# Patient Record
Sex: Male | Born: 1955 | Race: White | Hispanic: No | Marital: Married | Smoking: Never smoker
Health system: Southern US, Community
[De-identification: ages and names within clinical notes are randomized; demographics above are authoritative.]

## PROBLEM LIST (undated history)

## (undated) DIAGNOSIS — D6852 Prothrombin gene mutation: Secondary | ICD-10-CM

## (undated) DIAGNOSIS — E785 Hyperlipidemia, unspecified: Secondary | ICD-10-CM

## (undated) DIAGNOSIS — M1990 Unspecified osteoarthritis, unspecified site: Secondary | ICD-10-CM

## (undated) DIAGNOSIS — I82409 Acute embolism and thrombosis of unspecified deep veins of unspecified lower extremity: Secondary | ICD-10-CM

## (undated) DIAGNOSIS — N189 Chronic kidney disease, unspecified: Secondary | ICD-10-CM

## (undated) DIAGNOSIS — D649 Anemia, unspecified: Secondary | ICD-10-CM

## (undated) HISTORY — PX: ROTATOR CUFF REPAIR: SHX139

## (undated) HISTORY — PX: REPLACEMENT TOTAL KNEE BILATERAL: SUR1225

---
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
## 2007-03-14 ENCOUNTER — Ambulatory Visit (HOSPITAL_BASED_OUTPATIENT_CLINIC_OR_DEPARTMENT_OTHER): Admission: RE | Admit: 2007-03-14 | Discharge: 2007-03-14 | Payer: Self-pay | Admitting: Orthopedic Surgery

## 2008-08-10 ENCOUNTER — Inpatient Hospital Stay (HOSPITAL_COMMUNITY): Admission: RE | Admit: 2008-08-10 | Discharge: 2008-08-13 | Payer: Self-pay | Admitting: Orthopedic Surgery

## 2009-07-05 ENCOUNTER — Inpatient Hospital Stay (HOSPITAL_COMMUNITY): Admission: RE | Admit: 2009-07-05 | Discharge: 2009-07-08 | Payer: Self-pay | Admitting: Orthopedic Surgery

## 2010-04-03 LAB — BASIC METABOLIC PANEL
CO2: 31 mEq/L (ref 19–32)
Chloride: 101 mEq/L (ref 96–112)
GFR calc Af Amer: >60 mL/min (ref >60)
Sodium: 136 mEq/L (ref 135–145)

## 2010-04-03 LAB — CBC
HCT: 33.3 % — ABNORMAL LOW (ref 39.0–52.0)
HCT: 33.9 % — ABNORMAL LOW (ref 39.0–52.0)
Hemoglobin: 11.6 g/dL — ABNORMAL LOW (ref 13.0–17.0)
Hemoglobin: 11.7 g/dL — ABNORMAL LOW (ref 13.0–17.0)
Hemoglobin: 12.6 g/dL — ABNORMAL LOW (ref 13.0–17.0)
MCHC: 34.4 g/dL (ref 30.0–36.0)
MCHC: 34.9 g/dL (ref 30.0–36.0)
MCV: 94.4 fL (ref 78.0–100.0)
MCV: 95.2 fL (ref 78.0–100.0)
Platelets: 153 10*3/uL (ref 150–400)
RBC: 3.58 MIL/uL — ABNORMAL LOW (ref 4.22–5.81)
RBC: 3.84 MIL/uL — ABNORMAL LOW (ref 4.22–5.81)
RDW: 12.9 % (ref 11.5–15.5)
WBC: 7.1 10*3/uL (ref 4.0–10.5)
WBC: 8.1 10*3/uL (ref 4.0–10.5)

## 2010-04-03 LAB — PROTIME-INR: INR: 1.16 (ref 0.00–1.49)

## 2010-04-04 LAB — DIFFERENTIAL
Basophils Absolute: 0 10*3/uL (ref 0.0–0.1)
Basophils Relative: 1 % (ref 0–1)
Eosinophils Absolute: 0.2 10*3/uL (ref 0.0–0.7)
Eosinophils Relative: 4 % (ref 0–5)
Monocytes Absolute: 0.4 10*3/uL (ref 0.1–1.0)
Monocytes Relative: 6 % (ref 3–12)
Neutro Abs: 3.1 10*3/uL (ref 1.7–7.7)

## 2010-04-04 LAB — BASIC METABOLIC PANEL
CO2: 28 mEq/L (ref 19–32)
Calcium: 9.3 mg/dL (ref 8.4–10.5)
Chloride: 106 mEq/L (ref 96–112)
GFR calc Af Amer: >60 mL/min (ref >60)
Glucose, Bld: 100 mg/dL — ABNORMAL HIGH (ref 70–99)
Sodium: 140 mEq/L (ref 135–145)

## 2010-04-04 LAB — CBC
HCT: 44.3 % (ref 39.0–52.0)
Hemoglobin: 15.3 g/dL (ref 13.0–17.0)
MCHC: 34.6 g/dL (ref 30.0–36.0)
MCV: 94.1 fL (ref 78.0–100.0)
RBC: 4.7 MIL/uL (ref 4.22–5.81)
RDW: 13.3 % (ref 11.5–15.5)

## 2010-04-04 LAB — APTT: aPTT: 26 seconds (ref 24–37)

## 2010-04-04 LAB — URINALYSIS, ROUTINE W REFLEX MICROSCOPIC
Bilirubin Urine: NEGATIVE
Glucose, UA: NEGATIVE mg/dL
Hgb urine dipstick: NEGATIVE
Ketones, ur: NEGATIVE mg/dL
Nitrite: NEGATIVE
Specific Gravity, Urine: 1.011 (ref 1.005–1.030)
pH: 6 (ref 5.0–8.0)

## 2010-04-24 LAB — DIFFERENTIAL
Eosinophils Absolute: 0.2 10*3/uL (ref 0.0–0.7)
Eosinophils Relative: 3 % (ref 0–5)
Lymphocytes Relative: 31 % (ref 12–46)
Lymphs Abs: 1.7 10*3/uL (ref 0.7–4.0)
Monocytes Absolute: 0.4 10*3/uL (ref 0.1–1.0)

## 2010-04-24 LAB — CBC
HCT: 33.7 % — ABNORMAL LOW (ref 39.0–52.0)
HCT: 45.3 % (ref 39.0–52.0)
Hemoglobin: 12 g/dL — ABNORMAL LOW (ref 13.0–17.0)
Hemoglobin: 16 g/dL (ref 13.0–17.0)
MCV: 95.6 fL (ref 78.0–100.0)
MCV: 95.6 fL (ref 78.0–100.0)
MCV: 95.9 fL (ref 78.0–100.0)
Platelets: 121 10*3/uL — ABNORMAL LOW (ref 150–400)
Platelets: 153 10*3/uL (ref 150–400)
Platelets: 184 10*3/uL (ref 150–400)
RBC: 3.53 MIL/uL — ABNORMAL LOW (ref 4.22–5.81)
RBC: 3.59 MIL/uL — ABNORMAL LOW (ref 4.22–5.81)
RDW: 12.8 % (ref 11.5–15.5)
WBC: 10.1 10*3/uL (ref 4.0–10.5)
WBC: 5.7 10*3/uL (ref 4.0–10.5)
WBC: 7.4 10*3/uL (ref 4.0–10.5)
WBC: 8.5 10*3/uL (ref 4.0–10.5)

## 2010-04-24 LAB — PROTIME-INR
INR: 1.2 (ref 0.00–1.49)
INR: 1.6 — ABNORMAL HIGH (ref 0.00–1.49)
Prothrombin Time: 19.8 seconds — ABNORMAL HIGH (ref 11.6–15.2)
Prothrombin Time: 26.6 seconds — ABNORMAL HIGH (ref 11.6–15.2)

## 2010-04-24 LAB — BASIC METABOLIC PANEL
BUN: 14 mg/dL (ref 6–23)
BUN: 9 mg/dL (ref 6–23)
Calcium: 8.7 mg/dL (ref 8.4–10.5)
Chloride: 106 mEq/L (ref 96–112)
Creatinine, Ser: 1.1 mg/dL (ref 0.4–1.5)
GFR calc Af Amer: >60 mL/min (ref >60)
GFR calc non Af Amer: >60 mL/min (ref >60)
GFR calc non Af Amer: >60 mL/min (ref >60)
Glucose, Bld: 101 mg/dL — ABNORMAL HIGH (ref 70–99)
Potassium: 4.3 mEq/L (ref 3.5–5.1)
Sodium: 139 mEq/L (ref 135–145)

## 2010-04-24 LAB — TYPE AND SCREEN
ABO/RH(D): A POS
Antibody Screen: NEGATIVE

## 2010-04-24 LAB — URINALYSIS, ROUTINE W REFLEX MICROSCOPIC
Glucose, UA: NEGATIVE mg/dL
Hgb urine dipstick: NEGATIVE
pH: 6 (ref 5.0–8.0)

## 2010-04-24 LAB — ABO/RH: ABO/RH(D): A POS

## 2010-05-31 NOTE — Op Note (Signed)
Blake Kennedy, Blake Kennedy               ACCOUNT NO.:  [id number]   MEDICAL RECORD NO.:  [id number]          PATIENT TYPE:  INP   LOCATION:  5040                         FACILITY:  [facility]   PHYSICIAN:  Feliberto Gottron. Turner Daniels, M.D.   DATE OF BIRTH:  1955-11-20   DATE OF PROCEDURE:  08/10/2008  DATE OF DISCHARGE:                               OPERATIVE REPORT   PREOPERATIVE DIAGNOSIS:  End-stage arthritis of the left knee.   POSTOPERATIVE DIAGNOSIS:  End-stage arthritis of the left knee.   PROCEDURE:  Left total knee arthroplasty using the DePuy Sigma RP  components for left femur, 5 tibial baseplate, 10-mm Sigma RP bearing,  38-mm patellar button.  All components cemented double batch of DePuy HV  cement with 1500 mg of Zinacef in the cement.   SURGEON:  Feliberto Gottron.  Turner Daniels, MD   FIRST ASSISTANT:  Shirl Harris, PA-C   ANESTHETIC:  General endotracheal.   ESTIMATED BLOOD LOSS:  Minimal.   FLUID REPLACEMENT:  1500 mL of crystalloid.   DRAINS PLACED:  Foley catheter and two medium Hemovacs.   TOURNIQUET TIME:  1 hour and 30 minutes.   URINE OUTPUT:  300 mL.   INDICATIONS FOR PROCEDURE:  A 55 year old gentleman with end-stage  arthritis of the left knee with a significant varus deformity of about 5-  8 degrees, 10 degrees flexion contracture, bone-on-bone arthritic  changes by x-ray with significant pain who has failed conservative  treatment with anti-inflammatory medicines, physical therapy, cortisone  injections were provided temporary relief as did Supartz injections.  Unfortunately, his pain has returned.  He has a persistent lymph pain  that prevents activities of daily living and desires elective total knee  arthroplasty.  Risks and benefits of surgery were discussed and  questions were answered.   DESCRIPTION OF PROCEDURE:  The patient identified by armband and  received a left femoral nerve block in the holding area at [facility] and then preoperative IV antibiotics, 2  g of Ancef.  Taken to  operating room #5, appropriate anesthetic monitors were attached and  general endotracheal anesthesia induced with the patient in supine  position.  Foley catheter inserted.  Tourniquet applied high to the left  thigh.  Lateral post and foot positioner applied to the table and the  left lower extremity was prepped and draped in the usual sterile fashion  from the ankle to the tourniquet.  Time-out procedure performed.  The  left lower extremity was wrapped with an Esmarch bandage, tourniquet  inflated to 350 mmHg, we began the operation itself by making an  anterior midline incision centered over the patella, starting one  handbreadth above the patella and then going 1 cm medial and 2 cm distal  to the tibial tubercle.  Small bleeders and the subcutaneous tissue  identified and cauterized.  The transverse retinaculum over the patella  was sectioned and reflected medially and a medial parapatellar  arthrotomy was accomplished maintaining a 2-3 mm thick cuff of  quadriceps tendon distally for later repair.  The patella was everted  and prepatellar fat pad resected.  On the  medial side, the superficial  medial collateral ligament was then elevated off the proximal tibia left  intact distally going from anterior to posterior.  The patella was  everted.  The knee was then flexed exposing the arthritic medial femoral  condyle and medial tibial plateau, which were down to bone-on-bone.  The  anterior one-half of the menisci were resected, a 0.5-inch osteotome was  used to remove notch osteophytes and then the cruciate ligaments were  also resected.  A posteromedial Z retractor was placed on the proximal  tibia.  A McCulloch retractor through the notch and a lateral Homan  retractor exposing the proximal tibia with the knee hyperflexed.  We  then entered the center of the proximal tibia with a step drill followed  by the intramedullary rod and a 2-degree posterior slope  proximal tibial  cutting guide was pinned along the anterior-posterior axis allowing  removal of 4-5 mm of bone medially and about 9-10 mm of bone laterally.  The proximal tibial cut was then accomplished with the oscillating saw  without difficulty.  We then entered the distal femur 2 mm anterior to  the PCL origin with a step drill followed by the intramedullary rod set  at 5 degrees of left for an 11-mm distal femoral cut.  The guide was  pinned along the epicondylar axis and the distal femoral cut  accomplished.  We sized for a #4 femoral component, set the pins at 3  degrees of external rotation and hammered into place the distal femoral  cutting guide, fixed with the screw pins.  The anterior, posterior, and  chamfer cuts were then accomplished without difficulty followed by the  Sigma RP box cut.  The patella was then measured at 27 mm, felt to be a  38 or 41 button.  We set the cutting guide at 16 and removed the  posterior 11 mm of the patella without difficulty and sized for a 38  button.  The lollipop was drilled.  At this point,     the knee was  hyperflexed.  We sized for a #5 tibial baseplate, pinned the trial plate  in place followed by the smokestack in the conical reamer and the Delta  fin keel punch.  A 4 left femoral component was then hammered into place  and drilled for the lugs.  A 10-mm sigma RP bearing was snapped into  place, the knee brought to full extension without difficulty and  ligamentous stability was felt to be excellent.  The 38-mm trial button  on the patella tracked without any thumb pressure whatsoever.  At this  point, the trial components were removed, all bony surfaces were water  picked, clean, dried with suction and sponges.  A double batch of DePuy  HV cement with 1500 mg Zinacef was then mixed at the back table and  applied to all bony and metallic mating surfaces except for the  posterior condyles of the femur itself.  In order we hammered  into  place, a #5 tibial baseplate, a 4 left femoral component and pressed the  38-mm patellar button into place and held with a clamp and removed the  excess cement.  A 10-mm Sigma RP bearing was then snapped into place,  the knee brought to full extension and once again excess cement was  removed with [employer].  While the cement cured, a medium Hemovac  drains were placed from anterolateral approach and the wound irrigated  out normal saline solution pulse lavage.  After the cement had cured, we  checked our tracking one more time.  The parapatellar arthrotomy closed  with running #1 Vicryl suture, the subcutaneous tissue with 0 and 2-0  undyed Vicryl suture and the skin with skin staples.  A dressing of  Xeroform, 4 x 4 dressing sponges, Webril, and Ace wrap was then applied.  The tourniquet was let down.  The patient was awakened and taken to the  recovery room without difficulty.      Feliberto Gottron. Turner Daniels, M.D.  Electronically Signed     FJR/MEDQ  D:  08/10/2008  T:  08/10/2008  Job:  [id number]

## 2010-05-31 NOTE — Op Note (Signed)
NAME:  Blake Kennedy, LIMBAUGH               ACCOUNT NO.:  [id number]   MEDICAL RECORD NO.:  [id number]          PATIENT TYPE:  AMB   LOCATION:  NESC                         [facility]ACILITY:  [facility]   PHYSICIAN:  Deidre Ala, M.D.    DATE OF BIRTH:  20-Jul-1955   DATE OF PROCEDURE:  03/14/2007  DATE OF DISCHARGE:                               OPERATIVE REPORT   PREOPERATIVE DIAGNOSIS:  Left knee medial meniscus tear by MRI.   POSTOPERATIVE DIAGNOSES:  1. Grade 2 to 4 degenerative joint disease, medial compartment.  2. 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  3. Grade 2 stretch, anterior cruciate ligament.  4. Degenerative anterior posterior lateral meniscus tear.  5. Tight lateral retinaculum.  6. Large medial and lateral plicas.   PROCEDURE:  1. Left knee operative arthroscopy with abrasion ablation      chondroplasty, medial femoral condyle, medial tibial plateau,      patella and trochlea.  2. Shaving lateral meniscus.  3. Ablator shrinkage of anterior cruciate ligament.  4. Lateral retinacular release.  5. Medial and lateral plica excision.   SURGEON:  Doristine Section, M.D.   ASSISTANT:  Phineas Semen, P.A.-C.   ANESTHESIA:  General with LMA.   CULTURES:  None.   DRAINS:  None.   ESTIMATED BLOOD LOSS:  Minimal.   TOURNIQUET TIME:  36 minutes.   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 pediatrics who encouraged him to  come see a doctor with a 30 day history of increasing left knee pain  after a hard tennis, golf, paint ball, etc.  His date of first visit was  September 24, 2006.  He can remembered a plant and a twist, has had some  catching, locking, giving way, tenderness at the medial joint line and  lateral gastroc.  When we first saw him, we felt that he had evidence of  some mild narrowing on his x-ray, some patellofemoral osteophytes.  We  felt given the tenderness in the posterior lateral gastroc  and the sense  that he had a small posterolateral Baker's cyst with a positive calf  squeeze.  It was my feeling that he probably had a degenerative complex  posterior horn medial meniscus tear with probable Baker's cyst rupture  into the lateral gastroc versus a gastroc strain.  We did do a Doppler  test to rule out deep venous thrombosis and it was negative.  Ultimately  an MRI was obtained and the reading was that the cruciate ligaments were  intact.  There was mucinous degeneration of the posterior horn of the  medial meniscus with a partial tear at the meniscal root attachment with  a accompanying marrow edema in the tibia.  The lateral meniscus was  intact.  There were intact ligamentous structures.  There was mild  tricompartmental chondromalacia but no cartilage defects.  Small joint  effusion, no Baker's cyst, mild pes anserine bursitis.  He therefore was  continued on with conservative management, cortisone injection, etc.  The patient did not make ultimately good progress.  He  continued to have  pain and a little bit of catching and desired to proceed with surgical  intervention and diagnostic and operative arthroscopy.  At surgery we  saw on the medial femoral condyle, lateral aspect to bone, DJD grade 3  blending to grade 4 in an area of about the size of a dime.  There were  corresponding lesion on the medial tibial plateau with one area  underneath the meniscus that was also to bone with grade 3 changes  surrounding this.  This was all shaved, smooth and ablated.  Fortunately  his medial meniscus to direct vision and probing was intact and will  serve as a good joint pad.  The ACL however was torn was spilling of the  fibers centrally.  It had an excursion of about 5-7 mm anteriorly under  direct vision, but it did have an end point.  This we tried to smooth  and ablate to shrink somewhat.  The lateral meniscus had degenerative  fraying anterior-posterior but the joint  surfaces looked quite pristine.  There were huge medial lateral plicas in the gutters and behind the  patella, there was a grade 3 chondromalacia patella, mostly lateral and  grade 2 changes in the trochlea.  All of this was smoothed and shaved  and ablated with the ablator on 1.  There was also some synovitis in the  pouch.  There was lateral patellar tilt and track and lateral  retinacular release was carried out as well as ablation abrasion  chondroplasties, trimming of the lateral meniscus, plica excision and  smoothing of the medial femoral condyle, medial tibial plateau and the  ACL as above.   PROCEDURE:  With adequate anesthesia obtained LMA technique, 1 gram  Ancef given IV prophylaxis, the patient was placed in the supine  position.  The left lower extremity was prepped from the malleoli to the  leg holder in the standard fashion.  After standard prepping and  draping, Esmarch exsanguination was used.  The tourniquet was let up to  350 mmHg.  Superior lateral inflow portal was made.  The knee was  insufflated with normal saline with arthroscopic pump.  Medial and  lateral scope portals were then made.  The joint was thoroughly  inspected.  I then shaved the medial plica back to the sidewall and  lysed the medial band.  Ablator was used to smooth the surface.  There  was some fraying on the medial femoral condyle that I ablated.  I then  checked the medial meniscus, probed it and inspected it and found the  DJD on the joint surfaces which I shaved and smoothed with the ablator  on 1, making smooth congruent surfaces.  I then checked the ACL, probed  its center, did a stress test on it with anterior drawer and then used  the ablator inside and on top and outside to shrink it down with the  ablator on the coag noted.  I then shaved out gutter synovitis on the  lateral side, probed the lateral meniscus and shaved anterior-posterior  and smoothed with the ablator.  I then shaved out  the pouch synovitis.  I then smoothed the posterior patella and the trochlea and did an  arthroscopic lateral retinacular release from vastus for lateralis to  the joint line with good improvement in tilt and track.  Coagulation was  carried on the lateral geniculate vessels.  The knee was then irrigated  through the scope, 0.5% Marcaine with morphine was injected  into the  joint.  The portals were left open.  A bulky sterile compressive  dressing was applied with lateral foam pad for tamponade and EZ-wrap  placed.  The patient then having tolerated procedure well, was awakened,  taken to recovery room in satisfactory condition to be discharged per  outpatient routine, given Percocet for pain and told to call the office  for recheck tomorrow about 2:30.           ______________________________  V. Charlesetta Shanks, M.D.     VEP/MEDQ  D:  03/14/2007  T:  03/15/2007  Job:  [id number]

## 2010-06-03 NOTE — Discharge Summary (Signed)
Blake Kennedy, Blake Kennedy               ACCOUNT NO.:  [id number]   MEDICAL RECORD NO.:  [id number]          PATIENT TYPE:  INP   LOCATION:  5040                         FACILITY:  [facility]   PHYSICIAN:  Feliberto Gottron. Turner Daniels, M.D.   DATE OF BIRTH:  06/05/1955   DATE OF ADMISSION:  08/10/2008  DATE OF DISCHARGE:  08/13/2008                               DISCHARGE SUMMARY   CHIEF COMPLAINTS:  Left knee pain.   HISTORY OF PRESENT ILLNESS:  This is a 55 year old gentleman, who  complains of severe unremitting pain in his left knee despite  conservative treatment with anti-inflammatories, pain medication, and  activity modification.  He desires a surgical intervention.  All risks  and benefits of surgery were discussed with the patient's past medical  history.  He has no medical problems.  He has not had any surgery.   SOCIAL HISTORY:  He denies the use of tobacco and drinks occasional  alcohol.   FAMILY HISTORY:  Positive for coronary artery disease and hypertension.   ALLERGIES:  He has no known drug allergies.   CURRENT MEDICATIONS:  Ibuprofen 800 mg 1 p.o. t.i.d. p.r.n. pain.   PHYSICAL EXAMINATION:  Gross examination of the left knee demonstrates  the patient have a varus deformity and a 10-degree flexion contracture.  He is neurovascularly intact.   X-rays demonstrate bone-on-bone degenerative changes in the medial  compartment of the left knee.   PREOPERATIVE LABORATORIES:  White blood cells 5.7, red blood cells 4.74,  hemoglobin 16, hematocrit 45.3, and platelets 184.  PT 13.9, INR 1.0,  and PTT 26.  Sodium 139, potassium 4.3, chloride 106, glucose 101, BUN  14, and creatinine 1.13.  Urinalysis was within normal limits.   HOSPITAL COURSE:  Mr. Pepitone was admitted to [facility] on August 10, 2008, when he underwent left total knee arthroplasty.  The procedure was  performed by Dr. Gean Birchwood, and the patient tolerated it well.  Two  Hemovac drains were placed in the left knee.   Perioperative Foley  catheter was also placed.  The patient was placed on Lovenox and  Coumadin for DVT prophylaxis and was transferred to the floor.  On the  first postoperative day, the patient was awake and alert.  He reported 1  episode of nausea and vomiting that seemed to be improving.  He  complained of minimal pain in his left knee at rest.  His surgical drain  was removed without difficulty.  His dressing remained clean and dry.  Foley catheter was removed after physical therapy.  On the second  postoperative day, the patient was awake and alert.  He reported that  his pain was well controlled.  He ambulated 200 feet with physical  therapy.  Hemoglobin was 11.9.  Surgical dressing was partially soaked,  it was changed, and his incision was found to be benign.  On the third  postoperative day, the patient was awake and alert.  He was eating well  and denied any nausea or vomiting.  Hemoglobin was 12 and his dressing  remained clean and dry.  He passed all of  his physical therapy goals and  was discharged to home.   DISPOSITION:  The patient was discharged home on August 13, 2008.  He was  weightbearing as tolerated.  Home Health would manage his wound,  Coumadin and physical therapy.  He would return to the clinic in 7-10  days for x-rays and staple removal.   Discharge medicines were as per the [facility] with the addition of Percocet 5  mg 1-2 tablets p.o. q.4 h. p.r.n. pain and Coumadin to take as directed  with a target INR of 1.5 to 2.   FINAL DIAGNOSIS:  End-stage degenerative joint disease of the left knee.      Shirl Harris, PA      Feliberto Gottron. Turner Daniels, M.D.  Electronically Signed    JW/MEDQ  D:  08/31/2008  T:  09/01/2008  Job:  [id number]

## 2010-10-07 LAB — POCT HEMOGLOBIN-HEMACUE: Hemoglobin: 16.4

## 2011-05-02 IMAGING — CR DG CHEST 2V
2 series · 2 of 2 positions shown · non-contrast
Comparison: None.

CLINICAL DATA: Osteoarthritis preadmit.

CHEST - 2 VIEW

[view not recorded (1 of 2)]
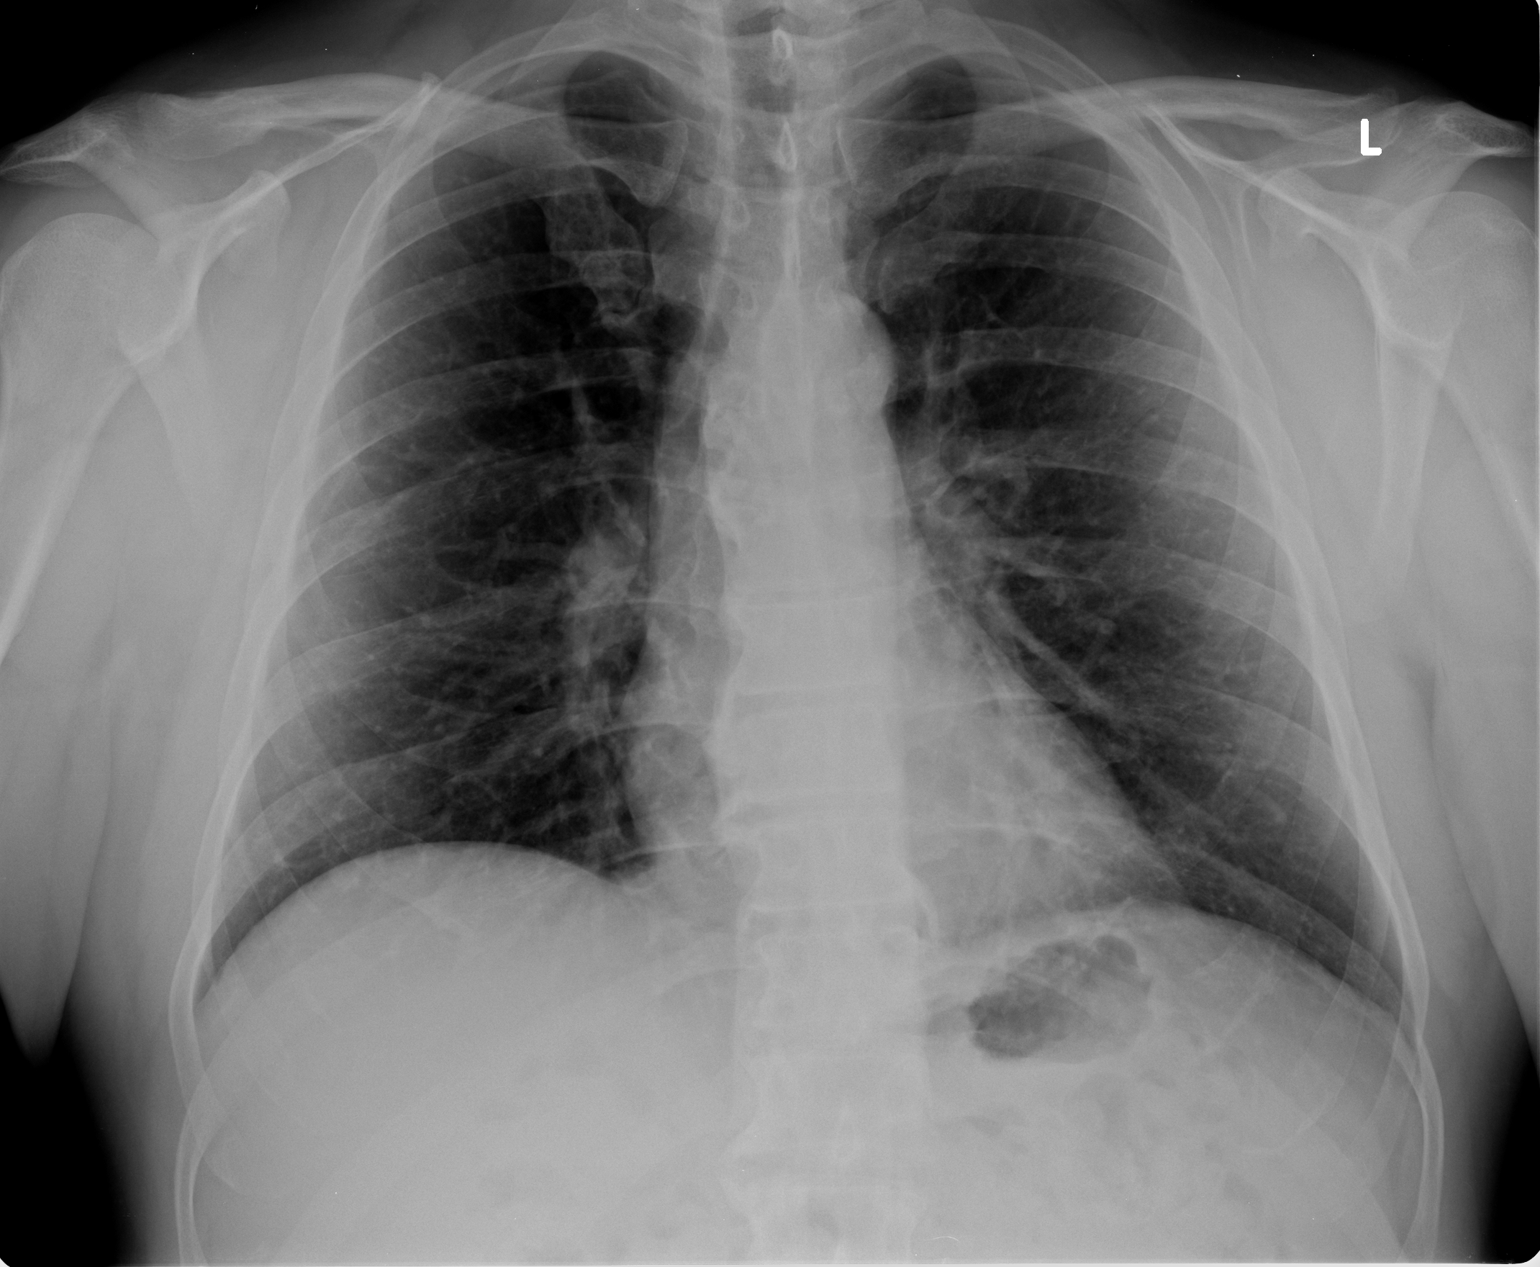

[view not recorded (2 of 2)]
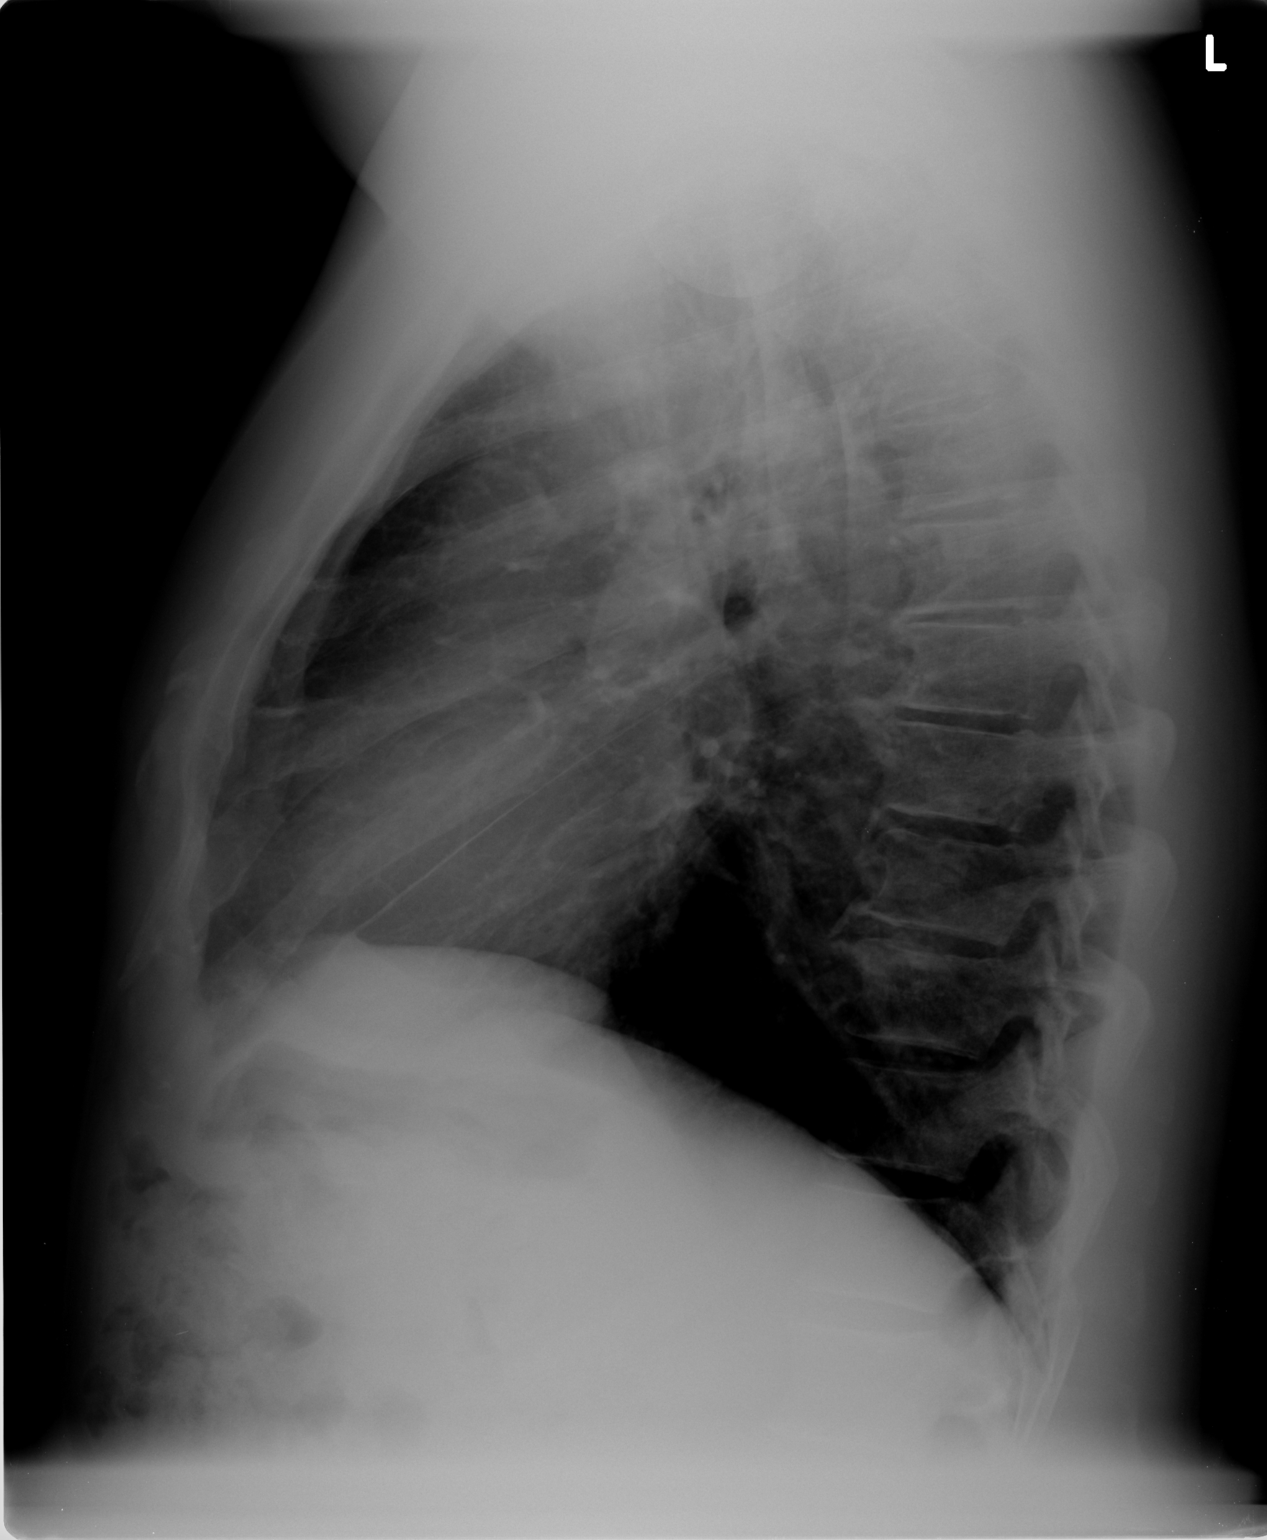

[2 of 2 positions shown; findings below may reference images not displayed]

FINDINGS: Heart size is normal and the vascularity is normal.  The
lungs are clear without infiltrate or effusion.  There is no mass
or adenopathy.  Mild degenerative changes in the thoracic spine.
IMPRESSION: No active cardiopulmonary disease.

## 2019-12-25 ENCOUNTER — Encounter (HOSPITAL_COMMUNITY): Payer: Self-pay

## 2019-12-25 ENCOUNTER — Other Ambulatory Visit: Payer: Self-pay | Admitting: Orthopedic Surgery

## 2019-12-25 DIAGNOSIS — T8484XA Pain due to internal orthopedic prosthetic devices, implants and grafts, initial encounter: Secondary | ICD-10-CM | POA: Diagnosis present

## 2019-12-25 DIAGNOSIS — Z01818 Encounter for other preprocedural examination: Secondary | ICD-10-CM

## 2019-12-25 DIAGNOSIS — Z96652 Presence of left artificial knee joint: Secondary | ICD-10-CM | POA: Diagnosis present

## 2019-12-25 NOTE — Patient Instructions (Addendum)
DUE TO COVID-19 ONLY ONE VISITOR IS ALLOWED TO COME WITH YOU AND STAY IN THE WAITING ROOM ONLY DURING PRE OP AND PROCEDURE.   IF YOU WILL BE ADMITTED INTO THE HOSPITAL YOU ARE ALLOWED ONE SUPPORT PERSON DURING VISITATION HOURS ONLY (10AM -8PM)   . The support person may change daily. . The support person must pass our screening, gel in and out, and wear a mask at all times, including in the patient's room. . Patients must also wear a mask when staff or their support person are in the room.   COVID SWAB TESTING MUST BE COMPLETED ON:  Friday, 12-26-19 @ 10:45 AM   [address] [place], [place] [address]  (Must self quarantine after testing. Follow instructions on handout.)        Your procedure is scheduled on:  Monday, 12-29-19   Report to [facility] Main  Entrance   Report to admitting at 1:00 PM   Call this number if you have problems the morning of surgery [phone]   Do not eat food :After Midnight.   May have liquids until 12:00 PM day of surgery  CLEAR LIQUID DIET  Foods Allowed                                                                     Foods Excluded  Water, Black Coffee and tea, regular and decaf              liquids that you cannot  Plain Jell-O in any flavor  (No red)                                    see through such as: Fruit ices (not with fruit pulp)                                      milk, soups, orange juice              Iced Popsicles (No red)                                      All solid food                                   Apple juices Sports drinks like Gatorade (No red) Lightly seasoned clear broth or consume(fat free) Sugar, honey syrup    Oral Hygiene is also important to reduce your risk of infection.                                    Remember - BRUSH YOUR TEETH THE MORNING OF SURGERY WITH YOUR REGULAR TOOTHPASTE   Do NOT smoke after Midnight   Take these medicines the morning of surgery with A SIP OF WATER:  Lipitor  You may not have any metal on your body including jewelry, and body piercings             Do not wear  lotions, powders, perfumes/cologne, or deodorant             Men may shave face and neck.   Do not bring valuables to the hospital. [facility] IS NOT RESPONSIBLE   FOR VALUABLES.   Contacts, dentures or bridgework may not be worn into surgery.   Patients discharged the day of surgery will not be allowed to drive home.   Special Instructions: Bring a copy of your healthcare power of attorney and living will documents         the day of surgery if you haven't scanned them in before.              Please read over the following fact sheets you were given: IF YOU HAVE QUESTIONS ABOUT YOUR PRE OP INSTRUCTIONS PLEASE CALL [phone]   [facility] - Preparing for Surgery Before surgery, you can play an important role.  Because skin is not sterile, your skin needs to be as free of germs as possible.  You can reduce the number of germs on your skin by washing with CHG (chlorahexidine gluconate) soap before surgery.  CHG is an antiseptic cleaner which kills germs and bonds with the skin to continue killing germs even after washing. Please DO NOT use if you have an allergy to CHG or antibacterial soaps.  If your skin becomes reddened/irritated stop using the CHG and inform your nurse when you arrive at Short Stay. Do not shave (including legs and underarms) for at least 48 hours prior to the first CHG shower.  You may shave your face/neck.  Please follow these instructions carefully:  1.  Shower with CHG Soap the night before surgery and the  morning of surgery.  2.  If you choose to wash your hair, wash your hair first as usual with your normal  shampoo.  3.  After you shampoo, rinse your hair and body thoroughly to remove the shampoo.                             4.  Use CHG as you would any other liquid soap.  You can apply chg directly to the skin and wash.  Gently  with a scrungie or clean washcloth.  5.  Apply the CHG Soap to your body ONLY FROM THE NECK DOWN.   Do   not use on face/ open                           Wound or open sores. Avoid contact with eyes, ears mouth and   genitals (private parts).                       Wash face,  Genitals (private parts) with your normal soap.             6.  Wash thoroughly, paying special attention to the area where your    surgery  will be performed.  7.  Thoroughly rinse your body with warm water from the neck down.  8.  DO NOT shower/wash with your normal soap after using and rinsing off the CHG Soap.                9.  Pat yourself dry with a clean towel.            10.  Wear clean pajamas.            11.  Place clean sheets on your bed the night of your first shower and do not  sleep with pets. Day of Surgery : Do not apply any lotions/deodorants the morning of surgery.  Please wear clean clothes to the hospital/surgery center.  FAILURE TO FOLLOW THESE INSTRUCTIONS MAY RESULT IN THE CANCELLATION OF YOUR SURGERY  PATIENT SIGNATURE_________________________________  NURSE SIGNATURE__________________________________  ________________________________________________________________________  WHAT IS A BLOOD TRANSFUSION? Blood Transfusion Information  A transfusion is the replacement of blood or some of its parts. Blood is made up of multiple cells which provide different functions.  Red blood cells carry oxygen and are used for blood loss replacement.  White blood cells fight against infection.  Platelets control bleeding.  Plasma helps clot blood.  Other blood products are available for specialized needs, such as hemophilia or other clotting disorders. BEFORE THE TRANSFUSION  Who gives blood for transfusions?   Healthy volunteers who are fully evaluated to make sure their blood is safe. This is blood bank blood. Transfusion therapy is the safest it has ever been in the practice of medicine. Before  blood is taken from a donor, a complete history is taken to make sure that person has no history of diseases nor engages in risky social behavior (examples are intravenous drug use or sexual activity with multiple partners). The donor's travel history is screened to minimize risk of transmitting infections, such as malaria. The donated blood is tested for signs of infectious diseases, such as HIV and hepatitis. The blood is then tested to be sure it is compatible with you in order to minimize the chance of a transfusion reaction. If you or a relative donates blood, this is often done in anticipation of surgery and is not appropriate for emergency situations. It takes many days to process the donated blood. RISKS AND COMPLICATIONS Although transfusion therapy is very safe and saves many lives, the main dangers of transfusion include:   Getting an infectious disease.  Developing a transfusion reaction. This is an allergic reaction to something in the blood you were given. Every precaution is taken to prevent this. The decision to have a blood transfusion has been considered carefully by your caregiver before blood is given. Blood is not given unless the benefits outweigh the risks. AFTER THE TRANSFUSION  Right after receiving a blood transfusion, you will usually feel much better and more energetic. This is especially true if your red blood cells have gotten low (anemic). The transfusion raises the level of the red blood cells which carry oxygen, and this usually causes an energy increase.  The nurse administering the transfusion will monitor you carefully for complications. HOME CARE INSTRUCTIONS  No special instructions are needed after a transfusion. You may find your energy is better. Speak with your caregiver about any limitations on activity for underlying diseases you may have. SEEK MEDICAL CARE IF:   Your condition is not improving after your transfusion.  You develop redness or irritation  at the intravenous (IV) site. SEEK IMMEDIATE MEDICAL CARE IF:  Any of the following symptoms occur over the next 12 hours:  Shaking chills.  You have a temperature by mouth above 102 F (38.9 C), not controlled by medicine.  Chest, back, or muscle pain.  People around you feel you are not acting correctly or are  confused.  Shortness of breath or difficulty breathing.  Dizziness and fainting.  You get a rash or develop hives.  You have a decrease in urine output.  Your urine turns a dark color or changes to pink, red, or brown. Any of the following symptoms occur over the next 10 days:  You have a temperature by mouth above 102 F (38.9 C), not controlled by medicine.  Shortness of breath.  Weakness after normal activity.  The white part of the eye turns yellow (jaundice).  You have a decrease in the amount of urine or are urinating less often.  Your urine turns a dark color or changes to pink, red, or brown. Document Released: 12/31/1999 Document Revised: 03/27/2011 Document Reviewed: 08/19/2007 [facility] Patient Information 2014 [place], [place].  _______________________________________________________________________

## 2019-12-25 NOTE — Progress Notes (Addendum)
COVID Vaccine Completed:  x2 Date COVID Vaccine completed:  04-04-19 & 05-02-19 COVID vaccine manufacturer: Pfizer    Moderna   [employer]   PCP - Olivia Canter, MD Cardiologist - [place] [facility] eval for DVT.  Last OV 07-05-11  Chest x-ray -  EKG -  12-26-19 in Epic Stress Test -  ECHO -  Cardiac Cath -  Pacemaker/ICD device last checked:  Sleep Study -  CPAP -   Fasting Blood Sugar -  Checks Blood Sugar _____ times a day  Blood Thinner Instructions: Xarelto 20 mg (hx of DVT).  Pt to check again today when he is to stop. Aspirin Instructions: Last Dose:12-26-19  Anesthesia review: Prothrombin three genetic mutation.  Patient denies shortness of breath, fever, cough and chest pain at PAT appointment   Patient verbalized understanding of instructions that were given to them at the PAT appointment. Patient was also instructed that they will need to review over the PAT instructions again at home before surgery.

## 2019-12-25 NOTE — Progress Notes (Signed)
Please enter orders for PAT visit 12-26-19.

## 2019-12-25 NOTE — H&P (Signed)
TOTAL KNEE REVISION ADMISSION H&P  Patient is being admitted for left revision total knee arthroplasty.  Subjective:  Chief Complaint:left knee pain.  HPI: Blake Kennedy, 64 y.o. male, has a history of pain and functional disability in the left knee(s) due to failed previous arthroplasty and patient has failed non-surgical conservative treatments for greater than 12 weeks to include NSAID's and/or analgesics, flexibility and strengthening excercises and activity modification. The indications for the revision of the total knee arthroplasty are bearing surface wear leading to implant or knee misalignment. Onset of symptoms was abrupt starting 1 years ago with rapidlly worsening course since that time.  Prior procedures on the left knee(s) include arthroplasty.  Patient currently rates pain in the left knee(s) at 10 out of 10 with activity. There is night pain, worsening of pain with activity and weight bearing, pain that interferes with activities of daily living and pain with passive range of motion.  Patient has evidence of joint space narrowing by imaging studies. This condition presents safety issues increasing the risk of falls.    There is no current active infection.  Patient Active Problem List   Diagnosis Date Noted  . Painful total knee replacement, left (HCC) 12/25/2019   Past Medical History:  Diagnosis Date  . Chronic kidney disease   . DVT (deep venous thrombosis) (HCC)       No current facility-administered medications for this encounter.   Current Outpatient Medications  Medication Sig Dispense Refill Last Dose  . atorvastatin (LIPITOR) 10 MG tablet Take 10 mg by mouth daily.     . rivaroxaban (XARELTO) 10 MG TABS tablet Take 10 mg by mouth daily.     . sildenafil (VIAGRA) 50 MG tablet Take 50 mg by mouth daily as needed for erectile dysfunction.      Not on File  Social History   Tobacco Use  . Smoking status: Not on file  . Smokeless tobacco: Not on file  Substance  Use Topics  . Alcohol use: Not on file    No family history on file.    Review of Systems  Constitutional: Negative.   HENT: Negative.   Eyes: Negative.   Respiratory: Negative.   Cardiovascular: Negative.   Gastrointestinal: Negative.   Endocrine: Negative.   Genitourinary: Negative.   Musculoskeletal: Positive for arthralgias.  Skin: Negative.   Allergic/Immunologic: Negative.   Neurological: Negative.   Hematological: Negative.   Psychiatric/Behavioral: Negative.      Objective:  Physical Exam Constitutional:      Appearance: Normal appearance.  HENT:     Head: Normocephalic and atraumatic.     Nose: Nose normal.  Cardiovascular:     Pulses: Normal pulses.  Pulmonary:     Effort: Pulmonary effort is normal.  Musculoskeletal:        General: Tenderness present.     Cervical back: Normal range of motion and neck supple.     Comments: Surgical wound is well-healed he has a 3-4+ effusion range of motion is 0/120 varus valgus reveals instability to varus stress with a good endpoint.    Skin:    General: Skin is warm and dry.  Neurological:     General: No focal deficit present.     Mental Status: He is alert and oriented to person, place, and time. Mental status is at baseline.  Psychiatric:        Mood and Affect: Mood normal.        Behavior: Behavior normal.  Thought Content: Thought content normal.        Judgment: Judgment normal.     Vital signs in last 24 hours: BP: ()/()  Arterial Line BP: ()/()   Labs:  There is no height or weight on file to calculate BMI.  Imaging Review Plain radiographs demonstrate AP lateral and sunrise x-rays show that the metal components of the left total knee are well-placed and well fixed no evidence of loosening the Sigma RP bearing is either fractured or worn medially and the knee is in varus.  Most importantly the lateral x-ray shows that there is still a gap between the femur and the tibia consistent with metal  not rubbing on metal.    Assessment/Plan:  End stage arthritis, left knee(s) with failed previous arthroplasty.   The patient history, physical examination, clinical judgment of the provider and imaging studies are consistent with end stage degenerative joint disease of the left knee(s), previous total knee arthroplasty. Revision total knee arthroplasty is deemed medically necessary. The treatment options including medical management, injection therapy, arthroscopy and revision arthroplasty were discussed at length. The risks and benefits of revision total knee arthroplasty were presented and reviewed. The risks due to aseptic loosening, infection, stiffness, patella tracking problems, thromboembolic complications and other imponderables were discussed. The patient acknowledged the explanation, agreed to proceed with the plan and consent was signed. Patient is being admitted for inpatient treatment for surgery, pain control, PT, OT, prophylactic antibiotics, VTE prophylaxis, progressive ambulation and ADL's and discharge planning.The patient is planning to be discharged home with home health services

## 2019-12-26 ENCOUNTER — Encounter (HOSPITAL_COMMUNITY)
Admission: RE | Admit: 2019-12-26 | Discharge: 2019-12-26 | Disposition: A | Discharge status: Home / Self Care | Payer: 59 | Source: Ambulatory Visit | Attending: Orthopedic Surgery | Admitting: Orthopedic Surgery

## 2019-12-26 ENCOUNTER — Other Ambulatory Visit (HOSPITAL_COMMUNITY)
Admission: RE | Admit: 2019-12-26 | Discharge: 2019-12-26 | Disposition: A | Discharge status: Home / Self Care | Payer: 59 | Source: Ambulatory Visit | Attending: Orthopedic Surgery | Admitting: Orthopedic Surgery

## 2019-12-26 ENCOUNTER — Other Ambulatory Visit: Payer: Self-pay

## 2019-12-26 ENCOUNTER — Encounter (HOSPITAL_COMMUNITY): Payer: Self-pay

## 2019-12-26 DIAGNOSIS — T84033A Mechanical loosening of internal left knee prosthetic joint, initial encounter: Secondary | ICD-10-CM | POA: Insufficient documentation

## 2019-12-26 DIAGNOSIS — X58XXXA Exposure to other specified factors, initial encounter: Secondary | ICD-10-CM | POA: Insufficient documentation

## 2019-12-26 DIAGNOSIS — Z79899 Other long term (current) drug therapy: Secondary | ICD-10-CM | POA: Insufficient documentation

## 2019-12-26 DIAGNOSIS — Z86718 Personal history of other venous thrombosis and embolism: Secondary | ICD-10-CM | POA: Insufficient documentation

## 2019-12-26 DIAGNOSIS — N189 Chronic kidney disease, unspecified: Secondary | ICD-10-CM | POA: Diagnosis not present

## 2019-12-26 DIAGNOSIS — Z7901 Long term (current) use of anticoagulants: Secondary | ICD-10-CM | POA: Insufficient documentation

## 2019-12-26 DIAGNOSIS — T8454XA Infection and inflammatory reaction due to internal left knee prosthesis, initial encounter: Secondary | ICD-10-CM | POA: Diagnosis not present

## 2019-12-26 DIAGNOSIS — M1712 Unilateral primary osteoarthritis, left knee: Secondary | ICD-10-CM | POA: Diagnosis not present

## 2019-12-26 DIAGNOSIS — M25562 Pain in left knee: Secondary | ICD-10-CM | POA: Diagnosis present

## 2019-12-26 DIAGNOSIS — Z01812 Encounter for preprocedural laboratory examination: Secondary | ICD-10-CM | POA: Insufficient documentation

## 2019-12-26 DIAGNOSIS — Z01818 Encounter for other preprocedural examination: Secondary | ICD-10-CM | POA: Insufficient documentation

## 2019-12-26 DIAGNOSIS — Z20822 Contact with and (suspected) exposure to covid-19: Secondary | ICD-10-CM | POA: Insufficient documentation

## 2019-12-26 HISTORY — DX: Anemia, unspecified: D64.9

## 2019-12-26 HISTORY — DX: Acute embolism and thrombosis of unspecified deep veins of unspecified lower extremity: I82.409

## 2019-12-26 HISTORY — DX: Unspecified osteoarthritis, unspecified site: M19.90

## 2019-12-26 HISTORY — DX: Hyperlipidemia, unspecified: E78.5

## 2019-12-26 HISTORY — DX: Prothrombin gene mutation: D68.52

## 2019-12-26 LAB — BASIC METABOLIC PANEL
Anion gap: 3 — ABNORMAL LOW (ref 5–15)
BUN: 12 mg/dL (ref 8–23)
CO2: 28 mmol/L (ref 22–32)
Calcium: 9.1 mg/dL (ref 8.9–10.3)
Chloride: 106 mmol/L (ref 98–111)
Creatinine, Ser: 0.78 mg/dL (ref 0.61–1.24)
GFR, Estimated: >60 mL/min (ref >60)
Glucose, Bld: 97 mg/dL (ref 70–99)
Potassium: 4.8 mmol/L (ref 3.5–5.1)
Sodium: 137 mmol/L (ref 135–145)

## 2019-12-26 LAB — CBC
HCT: 33.5 % — ABNORMAL LOW (ref 39.0–52.0)
Hemoglobin: 10.5 g/dL — ABNORMAL LOW (ref 13.0–17.0)
MCH: 29.7 pg (ref 26.0–34.0)
MCHC: 31.3 g/dL (ref 30.0–36.0)
MCV: 94.6 fL (ref 80.0–100.0)
Platelets: 232 10*3/uL (ref 150–400)
RBC: 3.54 MIL/uL — ABNORMAL LOW (ref 4.22–5.81)
RDW: 13.2 % (ref 11.5–15.5)
WBC: 4.3 10*3/uL (ref 4.0–10.5)
nRBC: 0 % (ref 0.0–0.2)

## 2019-12-26 LAB — SURGICAL PCR SCREEN
MRSA, PCR: NEGATIVE
Staphylococcus aureus: NEGATIVE

## 2019-12-26 LAB — SARS CORONAVIRUS 2 (TAT 6-24 HRS): SARS Coronavirus 2: NEGATIVE

## 2019-12-26 NOTE — Progress Notes (Signed)
Anesthesia Chart Review   Case: [id number] Date/Time: 12/29/19 1300   Procedure: LEFT TOTAL KNEE REVISION (Left Knee)   Anesthesia type: Spinal   Pre-op diagnosis: LOOSE LEFT KNEE REPLACEMENT   Location: [facility] RO[facility] 08 / [facility]   Surgeons: Gean Birchwood, MD      DISCUSSION:64 y.o. never smoker with h/o prothrombin gene mutation, DVT on Xarelto, loose left knee replacement scheduled with above procedure 12/29/2019 with Dr. Gean Birchwood.  At PAT visit 12/26/2019 pt had no instructions on when to hold Xarelto.  Currently booked with a spinal.  VM left with Dr. Wadie Lessen scheduler regarding Xarelto.  Pt was also advised to contact prescribing provider for instructions.  VS: Pulse 62   Temp 36.9 C (Oral)   Resp 18   Ht 5\' 10"  (1.778 m)   SpO2 100%   PROVIDERS: , MD is PCP    LABS: Labs reviewed: Acceptable for surgery. (all labs ordered are listed, but only abnormal results are displayed)  Labs Reviewed  BASIC METABOLIC PANEL - Abnormal; Notable for the following components:      Result Value   Anion gap 3 (*)    All other components within normal limits  CBC - Abnormal; Notable for the following components:   RBC 3.54 (*)    Hemoglobin 10.5 (*)    HCT 33.5 (*)    All other components within normal limits  SURGICAL PCR SCREEN  TYPE AND SCREEN     IMAGES:   EKG: 12/26/19 65bpm  CV:  Past Medical History:  Diagnosis Date  . Anemia   . Arthritis   . DVT (deep venous thrombosis) (HCC)   . Hyperlipidemia     Past Surgical History:  Procedure Laterality Date  . REPLACEMENT TOTAL KNEE BILATERAL    . ROTATOR CUFF REPAIR Left     MEDICATIONS: . atorvastatin (LIPITOR) 10 MG tablet  . Cholecalciferol (VITAMIN D3 PO)  . diphenhydramine-acetaminophen (TYLENOL PM) 25-500 MG TABS tablet  . Histamine Dihydrochloride (AUSTRALIAN DREAM ARTHRITIS EX)  . Multiple Vitamin (MULTIVITAMIN WITH MINERALS) TABS tablet  . Omega-3 Fatty Acids (FISH OIL) 1000 MG CAPS  .  pseudoephedrine (SUDAFED) 30 MG tablet  . rivaroxaban (XARELTO) 20 MG TABS tablet  . sildenafil (VIAGRA) 50 MG tablet  . TURMERIC PO   No current facility-administered medications for this encounter.    14/10/21, PA-C [facility] Pre-Surgical Testing [phone]

## 2019-12-28 MED ORDER — TRANEXAMIC ACID 1000 MG/10ML IV SOLN
2000.0000 mg | INTRAVENOUS | Status: DC
  Filled 2019-12-28: qty 20

## 2019-12-28 MED ORDER — BUPIVACAINE LIPOSOME 1.3 % IJ SUSP
20.0000 mL | Freq: Once | INTRAMUSCULAR | Status: DC
  Filled 2019-12-28: qty 20

## 2019-12-29 ENCOUNTER — Observation Stay (HOSPITAL_COMMUNITY)
Admission: AD | Admit: 2019-12-29 | Discharge: 2019-12-30 | Disposition: A | Discharge status: Home / Self Care | Payer: 59 | Attending: Orthopedic Surgery | Admitting: Orthopedic Surgery

## 2019-12-29 ENCOUNTER — Ambulatory Visit (HOSPITAL_COMMUNITY): Payer: 59 | Admitting: Physician Assistant

## 2019-12-29 ENCOUNTER — Encounter (HOSPITAL_COMMUNITY)
Admission: AD | Disposition: A | Discharge status: Home / Self Care | Payer: Self-pay | Source: Home / Self Care | Attending: Orthopedic Surgery

## 2019-12-29 ENCOUNTER — Ambulatory Visit (HOSPITAL_COMMUNITY): Payer: 59 | Admitting: Certified Registered"

## 2019-12-29 ENCOUNTER — Other Ambulatory Visit: Payer: Self-pay

## 2019-12-29 ENCOUNTER — Encounter (HOSPITAL_COMMUNITY): Payer: Self-pay | Admitting: Orthopedic Surgery

## 2019-12-29 DIAGNOSIS — N189 Chronic kidney disease, unspecified: Secondary | ICD-10-CM | POA: Insufficient documentation

## 2019-12-29 DIAGNOSIS — M1712 Unilateral primary osteoarthritis, left knee: Principal | ICD-10-CM | POA: Insufficient documentation

## 2019-12-29 DIAGNOSIS — T8454XA Infection and inflammatory reaction due to internal left knee prosthesis, initial encounter: Secondary | ICD-10-CM | POA: Insufficient documentation

## 2019-12-29 DIAGNOSIS — T8484XA Pain due to internal orthopedic prosthetic devices, implants and grafts, initial encounter: Secondary | ICD-10-CM

## 2019-12-29 DIAGNOSIS — Z96652 Presence of left artificial knee joint: Secondary | ICD-10-CM

## 2019-12-29 DIAGNOSIS — Z01818 Encounter for other preprocedural examination: Secondary | ICD-10-CM

## 2019-12-29 DIAGNOSIS — Z20822 Contact with and (suspected) exposure to covid-19: Secondary | ICD-10-CM | POA: Insufficient documentation

## 2019-12-29 HISTORY — PX: TOTAL KNEE REVISION: SHX996

## 2019-12-29 LAB — CBC WITH DIFFERENTIAL/PLATELET
Abs Immature Granulocytes: 0.01 10*3/uL (ref 0.00–0.07)
Basophils Absolute: 0.1 10*3/uL (ref 0.0–0.1)
Basophils Relative: 1 %
Eosinophils Absolute: 0.1 10*3/uL (ref 0.0–0.5)
Eosinophils Relative: 2 %
HCT: 37.4 % — ABNORMAL LOW (ref 39.0–52.0)
Hemoglobin: 12 g/dL — ABNORMAL LOW (ref 13.0–17.0)
Immature Granulocytes: 0 %
Lymphocytes Relative: 27 %
Lymphs Abs: 1.6 10*3/uL (ref 0.7–4.0)
MCH: 29.5 pg (ref 26.0–34.0)
MCHC: 32.1 g/dL (ref 30.0–36.0)
MCV: 91.9 fL (ref 80.0–100.0)
Monocytes Absolute: 0.5 10*3/uL (ref 0.1–1.0)
Monocytes Relative: 8 %
Neutro Abs: 3.6 10*3/uL (ref 1.7–7.7)
Neutrophils Relative %: 62 %
Platelets: 251 10*3/uL (ref 150–400)
RBC: 4.07 MIL/uL — ABNORMAL LOW (ref 4.22–5.81)
RDW: 13.1 % (ref 11.5–15.5)
WBC: 5.8 10*3/uL (ref 4.0–10.5)
nRBC: 0 % (ref 0.0–0.2)

## 2019-12-29 LAB — URINALYSIS, ROUTINE W REFLEX MICROSCOPIC
Bilirubin Urine: NEGATIVE
Glucose, UA: NEGATIVE mg/dL
Hgb urine dipstick: NEGATIVE
Ketones, ur: NEGATIVE mg/dL
Leukocytes,Ua: NEGATIVE
Nitrite: NEGATIVE
Protein, ur: NEGATIVE mg/dL
Specific Gravity, Urine: 1.005 (ref 1.005–1.030)
pH: 7 (ref 5.0–8.0)

## 2019-12-29 LAB — PROTIME-INR
INR: 1.1 (ref 0.8–1.2)
Prothrombin Time: 13.7 seconds (ref 11.4–15.2)

## 2019-12-29 LAB — TYPE AND SCREEN
ABO/RH(D): A POS
Antibody Screen: NEGATIVE

## 2019-12-29 LAB — APTT: aPTT: 26 seconds (ref 24–36)

## 2019-12-29 SURGERY — TOTAL KNEE REVISION
Anesthesia: Spinal | Site: Knee | Laterality: Left

## 2019-12-29 MED ORDER — BISACODYL 5 MG PO TBEC: 5.0000 mg | DELAYED_RELEASE_TABLET | Freq: Every day | ORAL | Status: DC | PRN

## 2019-12-29 MED ORDER — TIZANIDINE HCL 2 MG PO TABS: 2.0000 mg | ORAL_TABLET | Freq: Four times a day (QID) | ORAL | 0 refills | Status: AC | PRN

## 2019-12-29 MED ORDER — OXYCODONE HCL 5 MG PO TABS
5.0000 mg | ORAL_TABLET | ORAL | Status: DC | PRN
  Administered 2019-12-29 – 2019-12-30 (×2): 10 mg via ORAL
  Administered 2019-12-30: 5 mg via ORAL
  Filled 2019-12-29 (×2): qty 2
  Filled 2019-12-29: qty 1

## 2019-12-29 MED ORDER — ACETAMINOPHEN 325 MG PO TABS: 325.0000 mg | ORAL_TABLET | Freq: Four times a day (QID) | ORAL | Status: DC | PRN

## 2019-12-29 MED ORDER — 0.9 % SODIUM CHLORIDE (POUR BTL) OPTIME
TOPICAL | Status: DC | PRN
  Administered 2019-12-29: 14:00:00 1000 mL

## 2019-12-29 MED ORDER — LACTATED RINGERS IV BOLUS
250.0000 mL | Freq: Once | INTRAVENOUS | Status: AC
  Administered 2019-12-29: 17:00:00 250 mL via INTRAVENOUS

## 2019-12-29 MED ORDER — CHLORHEXIDINE GLUCONATE 0.12 % MT SOLN
15.0000 mL | Freq: Once | OROMUCOSAL | Status: AC
  Administered 2019-12-29: 11:00:00 15 mL via OROMUCOSAL

## 2019-12-29 MED ORDER — POLYMYXIN B-TRIMETHOPRIM 10000-0.1 UNIT/ML-% OP SOLN
1.0000 [drp] | Freq: Three times a day (TID) | OPHTHALMIC | Status: DC
  Administered 2019-12-29: 17:00:00 1 [drp] via OPHTHALMIC
  Filled 2019-12-29: qty 10

## 2019-12-29 MED ORDER — PROPOFOL 1000 MG/100ML IV EMUL
INTRAVENOUS | Status: AC
  Filled 2019-12-29: qty 100

## 2019-12-29 MED ORDER — DIPHENHYDRAMINE HCL 12.5 MG/5ML PO ELIX: 12.5000 mg | ORAL_SOLUTION | ORAL | Status: DC | PRN

## 2019-12-29 MED ORDER — ONDANSETRON HCL 4 MG PO TABS: 4.0000 mg | ORAL_TABLET | Freq: Four times a day (QID) | ORAL | Status: DC | PRN

## 2019-12-29 MED ORDER — METHOCARBAMOL 1000 MG/10ML IJ SOLN
500.0000 mg | Freq: Four times a day (QID) | INTRAVENOUS | Status: DC | PRN
  Filled 2019-12-29: qty 5

## 2019-12-29 MED ORDER — POLYETHYLENE GLYCOL 3350 17 G PO PACK: 17.0000 g | PACK | Freq: Every day | ORAL | Status: DC | PRN

## 2019-12-29 MED ORDER — PHENOL 1.4 % MT LIQD
1.0000 | OROMUCOSAL | Status: DC | PRN
  Filled 2019-12-29: qty 177

## 2019-12-29 MED ORDER — BUPIVACAINE-EPINEPHRINE (PF) 0.25% -1:200000 IJ SOLN
INTRAMUSCULAR | Status: AC
  Filled 2019-12-29: qty 30

## 2019-12-29 MED ORDER — MENTHOL 3 MG MT LOZG: 1.0000 | LOZENGE | OROMUCOSAL | Status: DC | PRN

## 2019-12-29 MED ORDER — LACTATED RINGERS IV SOLN: INTRAVENOUS | Status: DC

## 2019-12-29 MED ORDER — ALUM & MAG HYDROXIDE-SIMETH 200-200-20 MG/5ML PO SUSP: 30.0000 mL | ORAL | Status: DC | PRN

## 2019-12-29 MED ORDER — PHENYLEPHRINE 40 MCG/ML (10ML) SYRINGE FOR IV PUSH (FOR BLOOD PRESSURE SUPPORT)
PREFILLED_SYRINGE | INTRAVENOUS | Status: DC | PRN
  Administered 2019-12-29: 120 ug via INTRAVENOUS

## 2019-12-29 MED ORDER — PROPOFOL 10 MG/ML IV BOLUS
INTRAVENOUS | Status: DC | PRN
  Administered 2019-12-29: 20 mg via INTRAVENOUS
  Administered 2019-12-29 (×3): 10 mg via INTRAVENOUS

## 2019-12-29 MED ORDER — ORAL CARE MOUTH RINSE: 15.0000 mL | Freq: Once | OROMUCOSAL | Status: AC

## 2019-12-29 MED ORDER — METOCLOPRAMIDE HCL 5 MG/ML IJ SOLN: 5.0000 mg | Freq: Three times a day (TID) | INTRAMUSCULAR | Status: DC | PRN

## 2019-12-29 MED ORDER — BSS IO SOLN
15.0000 mL | Freq: Once | INTRAOCULAR | Status: AC
  Administered 2019-12-29: 17:00:00 15 mL
  Filled 2019-12-29: qty 15

## 2019-12-29 MED ORDER — BUPIVACAINE IN DEXTROSE 0.75-8.25 % IT SOLN
INTRATHECAL | Status: DC | PRN
  Administered 2019-12-29: 2 mL via INTRATHECAL

## 2019-12-29 MED ORDER — TRANEXAMIC ACID-NACL 1000-0.7 MG/100ML-% IV SOLN
1000.0000 mg | INTRAVENOUS | Status: AC
  Administered 2019-12-29: 13:00:00 1000 mg via INTRAVENOUS
  Filled 2019-12-29: qty 100

## 2019-12-29 MED ORDER — SODIUM CHLORIDE (PF) 0.9 % IJ SOLN
INTRAMUSCULAR | Status: AC
  Filled 2019-12-29: qty 20

## 2019-12-29 MED ORDER — PHENYLEPHRINE HCL-NACL 10-0.9 MG/250ML-% IV SOLN
INTRAVENOUS | Status: DC | PRN
  Administered 2019-12-29: 30 ug/min via INTRAVENOUS

## 2019-12-29 MED ORDER — PANTOPRAZOLE SODIUM 40 MG PO TBEC
40.0000 mg | DELAYED_RELEASE_TABLET | Freq: Every day | ORAL | Status: DC
Start: 2019-12-29 — End: 2019-12-30
  Filled 2019-12-29: qty 1

## 2019-12-29 MED ORDER — TRANEXAMIC ACID 1000 MG/10ML IV SOLN
INTRAVENOUS | Status: DC | PRN
  Administered 2019-12-29: 14:00:00 2000 mg via TOPICAL

## 2019-12-29 MED ORDER — PHENYLEPHRINE HCL (PRESSORS) 10 MG/ML IV SOLN
INTRAVENOUS | Status: AC
  Filled 2019-12-29: qty 1

## 2019-12-29 MED ORDER — STERILE WATER FOR IRRIGATION IR SOLN
Status: DC | PRN
  Administered 2019-12-29: 2000 mL

## 2019-12-29 MED ORDER — OXYCODONE-ACETAMINOPHEN 5-325 MG PO TABS: 1.0000 | ORAL_TABLET | ORAL | 0 refills | Status: AC | PRN

## 2019-12-29 MED ORDER — METHOCARBAMOL 500 MG PO TABS
500.0000 mg | ORAL_TABLET | Freq: Four times a day (QID) | ORAL | Status: DC | PRN
  Administered 2019-12-29 – 2019-12-30 (×2): 500 mg via ORAL
  Filled 2019-12-29 (×2): qty 1

## 2019-12-29 MED ORDER — ONDANSETRON HCL 4 MG/2ML IJ SOLN
INTRAMUSCULAR | Status: AC
  Filled 2019-12-29: qty 2

## 2019-12-29 MED ORDER — METOCLOPRAMIDE HCL 5 MG PO TABS: 5.0000 mg | ORAL_TABLET | Freq: Three times a day (TID) | ORAL | Status: DC | PRN

## 2019-12-29 MED ORDER — BUPIVACAINE LIPOSOME 1.3 % IJ SUSP
INTRAMUSCULAR | Status: DC | PRN
  Administered 2019-12-29: 20 mL

## 2019-12-29 MED ORDER — MIDAZOLAM HCL 2 MG/2ML IJ SOLN
1.0000 mg | Freq: Once | INTRAMUSCULAR | Status: AC
  Administered 2019-12-29: 12:00:00 2 mg via INTRAVENOUS
  Filled 2019-12-29: qty 2

## 2019-12-29 MED ORDER — CEFAZOLIN SODIUM-DEXTROSE 2-4 GM/100ML-% IV SOLN
2.0000 g | INTRAVENOUS | Status: AC
  Administered 2019-12-29: 13:00:00 2 g via INTRAVENOUS
  Filled 2019-12-29: qty 100

## 2019-12-29 MED ORDER — SODIUM CHLORIDE (PF) 0.9 % IJ SOLN
INTRAMUSCULAR | Status: AC
  Filled 2019-12-29: qty 50

## 2019-12-29 MED ORDER — PROPOFOL 500 MG/50ML IV EMUL
INTRAVENOUS | Status: DC | PRN
  Administered 2019-12-29: 125 ug/kg/min via INTRAVENOUS

## 2019-12-29 MED ORDER — POVIDONE-IODINE 10 % EX SWAB
2.0000 "application " | Freq: Once | CUTANEOUS | Status: AC
  Administered 2019-12-29: 2 via TOPICAL

## 2019-12-29 MED ORDER — DEXAMETHASONE SODIUM PHOSPHATE 10 MG/ML IJ SOLN
INTRAMUSCULAR | Status: AC
  Filled 2019-12-29: qty 1

## 2019-12-29 MED ORDER — DEXAMETHASONE SODIUM PHOSPHATE 10 MG/ML IJ SOLN
10.0000 mg | Freq: Once | INTRAMUSCULAR | Status: AC
  Administered 2019-12-30: 09:00:00 10 mg via INTRAVENOUS
  Filled 2019-12-29: qty 1

## 2019-12-29 MED ORDER — ONDANSETRON HCL 4 MG/2ML IJ SOLN: 4.0000 mg | Freq: Four times a day (QID) | INTRAMUSCULAR | Status: DC | PRN

## 2019-12-29 MED ORDER — KCL IN DEXTROSE-NACL 20-5-0.45 MEQ/L-%-% IV SOLN
INTRAVENOUS | Status: DC
  Filled 2019-12-29: qty 1000

## 2019-12-29 MED ORDER — PROPOFOL 500 MG/50ML IV EMUL
INTRAVENOUS | Status: AC
  Filled 2019-12-29: qty 50

## 2019-12-29 MED ORDER — ONDANSETRON HCL 4 MG/2ML IJ SOLN
INTRAMUSCULAR | Status: DC | PRN
  Administered 2019-12-29: 4 mg via INTRAVENOUS

## 2019-12-29 MED ORDER — OXYCODONE HCL 5 MG PO TABS: 10.0000 mg | ORAL_TABLET | ORAL | Status: DC | PRN

## 2019-12-29 MED ORDER — FENTANYL CITRATE (PF) 100 MCG/2ML IJ SOLN
50.0000 ug | Freq: Once | INTRAMUSCULAR | Status: AC
  Administered 2019-12-29: 12:00:00 100 ug via INTRAVENOUS
  Filled 2019-12-29: qty 2

## 2019-12-29 MED ORDER — KETOROLAC TROMETHAMINE 0.5 % OP SOLN
1.0000 [drp] | Freq: Three times a day (TID) | OPHTHALMIC | Status: DC | PRN
  Administered 2019-12-29: 17:00:00 1 [drp] via OPHTHALMIC
  Filled 2019-12-29: qty 5

## 2019-12-29 MED ORDER — HYDROMORPHONE HCL 1 MG/ML IJ SOLN
0.2500 mg | INTRAMUSCULAR | Status: DC | PRN
Start: 2019-12-29 — End: 2019-12-29

## 2019-12-29 MED ORDER — RIVAROXABAN 10 MG PO TABS: 10.0000 mg | ORAL_TABLET | Freq: Every day | ORAL | 0 refills | Status: AC

## 2019-12-29 MED ORDER — RIVAROXABAN 10 MG PO TABS
10.0000 mg | ORAL_TABLET | Freq: Every day | ORAL | Status: DC
  Administered 2019-12-30: 08:00:00 10 mg via ORAL
  Filled 2019-12-29 (×2): qty 1

## 2019-12-29 MED ORDER — BUPIVACAINE-EPINEPHRINE 0.25% -1:200000 IJ SOLN
INTRAMUSCULAR | Status: DC | PRN
  Administered 2019-12-29: 20 mL

## 2019-12-29 MED ORDER — SODIUM CHLORIDE (PF) 0.9 % IJ SOLN
INTRAMUSCULAR | Status: DC | PRN
  Administered 2019-12-29: 70 mL

## 2019-12-29 MED ORDER — PROPOFOL 10 MG/ML IV BOLUS
INTRAVENOUS | Status: AC
  Filled 2019-12-29: qty 20

## 2019-12-29 MED ORDER — LACTATED RINGERS IV BOLUS
500.0000 mL | Freq: Once | INTRAVENOUS | Status: AC
  Administered 2019-12-29: 15:00:00 500 mL via INTRAVENOUS

## 2019-12-29 MED ORDER — FLEET ENEMA 7-19 GM/118ML RE ENEM: 1.0000 | ENEMA | Freq: Once | RECTAL | Status: DC | PRN

## 2019-12-29 MED ORDER — HYDROMORPHONE HCL 1 MG/ML IJ SOLN: 0.5000 mg | INTRAMUSCULAR | Status: DC | PRN

## 2019-12-29 MED ORDER — DEXAMETHASONE SODIUM PHOSPHATE 10 MG/ML IJ SOLN
INTRAMUSCULAR | Status: DC | PRN
  Administered 2019-12-29: 8 mg via INTRAVENOUS

## 2019-12-29 MED ORDER — PHENYLEPHRINE 40 MCG/ML (10ML) SYRINGE FOR IV PUSH (FOR BLOOD PRESSURE SUPPORT)
PREFILLED_SYRINGE | INTRAVENOUS | Status: AC
  Filled 2019-12-29: qty 10

## 2019-12-29 MED ORDER — DOCUSATE SODIUM 100 MG PO CAPS
100.0000 mg | ORAL_CAPSULE | Freq: Two times a day (BID) | ORAL | Status: DC
Start: 2019-12-29 — End: 2019-12-30
  Administered 2019-12-29 – 2019-12-30 (×2): 100 mg via ORAL
  Filled 2019-12-29 (×3): qty 1

## 2019-12-29 SURGICAL SUPPLY — 56 items
BAG DECANTER FOR FLEXI CONT (MISCELLANEOUS) ×3 IMPLANT
BAG ZIPLOCK 12X15 (MISCELLANEOUS) ×3 IMPLANT
BLADE OSCILLATING/SAGITTAL (BLADE) ×4
BLADE SAG 18X100X1.27 (BLADE) ×3 IMPLANT
BLADE SAW SGTL 11.0X1.19X90.0M (BLADE) ×3 IMPLANT
BLADE SAW SGTL 81X20 HD (BLADE) ×3 IMPLANT
BLADE SURG SZ10 CARB STEEL (BLADE) ×6 IMPLANT
BLADE SW THK.38XMED LNG THN (BLADE) ×2 IMPLANT
BNDG ELASTIC 6X10 VLCR STRL LF (GAUZE/BANDAGES/DRESSINGS) ×6 IMPLANT
BNDG ELASTIC 6X5.8 VLCR STR LF (GAUZE/BANDAGES/DRESSINGS) ×3 IMPLANT
BOWL SMART MIX CTS (DISPOSABLE) ×3 IMPLANT
BRUSH FEMORAL CANAL (MISCELLANEOUS) ×3 IMPLANT
BUR OVAL CARBIDE 4.0 (BURR) IMPLANT
CEMENT HV SMART SET (Cement) ×3 IMPLANT
COVER SURGICAL LIGHT HANDLE (MISCELLANEOUS) ×3 IMPLANT
COVER WAND RF STERILE (DRAPES) IMPLANT
CUFF TOURN SGL QUICK 34 (TOURNIQUET CUFF) ×2
CUFF TRNQT CYL 34X4.125X (TOURNIQUET CUFF) ×1 IMPLANT
DECANTER SPIKE VIAL GLASS SM (MISCELLANEOUS) ×9 IMPLANT
DRAPE ORTHO SPLIT 77X108 STRL (DRAPES) ×4
DRAPE SURG ORHT 6 SPLT 77X108 (DRAPES) ×2 IMPLANT
DRAPE U-SHAPE 47X51 STRL (DRAPES) ×3 IMPLANT
DRESSING AQUACEL AG SP 3.5X10 (GAUZE/BANDAGES/DRESSINGS) ×1 IMPLANT
DRSG AQUACEL AG ADV 3.5X14 (GAUZE/BANDAGES/DRESSINGS) IMPLANT
DRSG AQUACEL AG SP 3.5X10 (GAUZE/BANDAGES/DRESSINGS) ×3
DURAPREP 26ML APPLICATOR (WOUND CARE) ×3 IMPLANT
ELECT REM PT RETURN 15FT ADLT (MISCELLANEOUS) ×3 IMPLANT
GLOVE BIO SURGEON STRL SZ7.5 (GLOVE) ×3 IMPLANT
GLOVE BIO SURGEON STRL SZ8.5 (GLOVE) ×3 IMPLANT
GLOVE BIOGEL PI IND STRL 8 (GLOVE) ×1 IMPLANT
GLOVE BIOGEL PI IND STRL 9 (GLOVE) ×1 IMPLANT
GLOVE BIOGEL PI INDICATOR 8 (GLOVE) ×2
GLOVE BIOGEL PI INDICATOR 9 (GLOVE) ×2
GOWN STRL REUS W/TWL XL LVL3 (GOWN DISPOSABLE) ×6 IMPLANT
HANDPIECE INTERPULSE COAX TIP (DISPOSABLE) ×2
HOOD PEEL AWAY FLYTE STAYCOOL (MISCELLANEOUS) ×9 IMPLANT
KIT TURNOVER KIT A (KITS) IMPLANT
NEEDLE HYPO 21X1.5 SAFETY (NEEDLE) ×6 IMPLANT
NS IRRIG 1000ML POUR BTL (IV SOLUTION) ×3 IMPLANT
PACK TOTAL KNEE CUSTOM (KITS) ×3 IMPLANT
PATELLA DOME PFC 38MM (Knees) ×3 IMPLANT
PENCIL SMOKE EVACUATOR (MISCELLANEOUS) IMPLANT
PLATE ROT INSERT 10MM SIZE 4 (Plate) ×3 IMPLANT
PROTECTOR NERVE ULNAR (MISCELLANEOUS) ×3 IMPLANT
SET HNDPC FAN SPRY TIP SCT (DISPOSABLE) ×1 IMPLANT
STAPLER VISISTAT 35W (STAPLE) IMPLANT
SUT VIC AB 1 CTX 36 (SUTURE) ×2
SUT VIC AB 1 CTX36XBRD ANBCTR (SUTURE) ×1 IMPLANT
SUT VIC AB 3-0 CT1 27 (SUTURE) ×2
SUT VIC AB 3-0 CT1 TAPERPNT 27 (SUTURE) ×1 IMPLANT
SWAB COLLECTION DEVICE MRSA (MISCELLANEOUS) IMPLANT
SWAB CULTURE ESWAB REG 1ML (MISCELLANEOUS) IMPLANT
SYR CONTROL 10ML LL (SYRINGE) ×6 IMPLANT
TRAY FOLEY MTR SLVR 16FR STAT (SET/KITS/TRAYS/PACK) ×3 IMPLANT
WATER STERILE IRR 1000ML POUR (IV SOLUTION) ×6 IMPLANT
WRAP KNEE MAXI GEL POST OP (GAUZE/BANDAGES/DRESSINGS) ×3 IMPLANT

## 2019-12-29 NOTE — Anesthesia Postprocedure Evaluation (Signed)
Anesthesia Post Note  Patient: Blake Kennedy  Procedure(s) Performed: LEFT TOTAL KNEE REVISION (Left Knee)     Patient location during evaluation: PACU Anesthesia Type: Spinal Level of consciousness: awake Pain management: pain level controlled Vital Signs Assessment: post-procedure vital signs reviewed and stable Respiratory status: spontaneous breathing Cardiovascular status: stable Postop Assessment: no apparent nausea or vomiting Anesthetic complications: no   No complications documented.  Last Vitals:  Vitals:   12/29/19 1530 12/29/19 1545  BP: 112/71 120/69  Pulse: 63 66  Resp: 16 13  Temp: (!) 36.1 C   SpO2: 100% 95%    Last Pain:  Vitals:   12/29/19 1545  TempSrc:   PainSc: 0-No pain    LLE Motor Response: No movement due to regional block (12/29/19 1545) LLE Sensation: No sensation (absent) (12/29/19 1545) RLE Motor Response: No movement due to regional block (12/29/19 1545) RLE Sensation: No sensation (absent) (12/29/19 1545) L Sensory Level: L1-Inguinal (groin) region (12/29/19 1545) R Sensory Level: T12-Inguinal (groin) region (12/29/19 1545)  Sherrill Buikema

## 2019-12-29 NOTE — Transfer of Care (Signed)
Immediate Anesthesia Transfer of Care Note  Patient: Blake Kennedy  Procedure(s) Performed: LEFT TOTAL KNEE REVISION (Left Knee)  Patient Location: PACU  Anesthesia Type:Spinal  Level of Consciousness: awake, alert  and oriented  Airway & Oxygen Therapy: Patient Spontanous Breathing and Patient connected to face mask oxygen  Post-op Assessment: Report given to RN and Post -op Vital signs reviewed and stable  Post vital signs: Reviewed and stable  Last Vitals:  Vitals Value Taken Time  BP 101/62 12/29/19 1511  Temp    Pulse 63 12/29/19 1515  Resp 12 12/29/19 1515  SpO2 100 % 12/29/19 1515  Vitals shown include unvalidated device data.  Last Pain:  Vitals:   12/29/19 1152  TempSrc:   PainSc: 0-No pain      Patients Stated Pain Goal: 4 (12/29/19 1050)  Complications: No complications documented.

## 2019-12-29 NOTE — Anesthesia Preprocedure Evaluation (Addendum)
Anesthesia Evaluation  Patient identified by MRN, date of birth, ID band Patient awake    Reviewed: Allergy & Precautions, NPO status , Patient's Chart, lab work & pertinent test results  Airway Mallampati: II  TM Distance: >3 FB     Dental   Pulmonary    breath sounds clear to auscultation       Cardiovascular negative cardio ROS   Rhythm:Regular Rate:Normal     Neuro/Psych    GI/Hepatic negative GI ROS, Neg liver ROS,   Endo/Other  negative endocrine ROS  Renal/GU negative Renal ROS     Musculoskeletal  (+) Arthritis ,   Abdominal   Peds  Hematology  (+) anemia ,   Anesthesia Other Findings   Reproductive/Obstetrics                            Anesthesia Physical Anesthesia Plan  ASA: II  Anesthesia Plan: Spinal   Post-op Pain Management:  Regional for Post-op pain   Induction: Intravenous  PONV Risk Score and Plan: 3 and Ondansetron, Dexamethasone and Midazolam  Airway Management Planned: Simple Face Mask and Nasal Cannula  Additional Equipment:   Intra-op Plan:   Post-operative Plan:   Informed Consent: I have reviewed the patients History and Physical, chart, labs and discussed the procedure including the risks, benefits and alternatives for the proposed anesthesia with the patient or authorized representative who has indicated his/her understanding and acceptance.     Dental advisory given  Plan Discussed with: CRNA and Anesthesiologist  Anesthesia Plan Comments:        Anesthesia Quick Evaluation

## 2019-12-29 NOTE — Anesthesia Procedure Notes (Signed)
Anesthesia Regional Block: Adductor canal block   Pre-Anesthetic Checklist: ,, timeout performed, Correct Patient, Correct Site, Correct Laterality, Correct Procedure, Correct Position, site marked, Risks and benefits discussed,  Surgical consent,  Pre-op evaluation,  At surgeon's request and post-op pain management  Laterality: Left  Prep: chloraprep       Needles:  Injection technique: Single-shot  Needle Type: Echogenic Stimulator Needle          Additional Needles:   Procedures: Doppler guided,,,, ultrasound used (permanent image in chart),,,,  Narrative:  Start time: 12/29/2019 11:40 AM End time: 12/29/2019 11:45 AM Injection made incrementally with aspirations every 5 mL.  Performed by: Personally  Anesthesiologist: Dorris Singh, MD

## 2019-12-29 NOTE — Anesthesia Procedure Notes (Signed)
Procedure Name: MAC Date/Time: 12/29/2019 1:02 PM Performed by: Niel Hummer, CRNA Pre-anesthesia Checklist: Patient identified, Emergency Drugs available, Suction available and Patient being monitored Oxygen Delivery Method: Simple face mask

## 2019-12-29 NOTE — Progress Notes (Signed)
Orthopedic Tech Progress Note Patient Details:  Blake Kennedy 1955-09-17 [id number]  Ortho Devices Type of Ortho Device: Bone foam zero knee Ortho Device/Splint Location: LEFT Ortho Device/Splint Interventions: Application   Post Interventions Patient Tolerated: Well Instructions Provided: Care of device   Saul Fordyce 12/29/2019, 4:02 PM

## 2019-12-29 NOTE — Interval H&P Note (Signed)
History and Physical Interval Note:  12/29/2019 10:57 AM  Blake Kennedy  has presented today for surgery, with the diagnosis of LOOSE LEFT KNEE REPLACEMENT.  The various methods of treatment have been discussed with the patient and family. After consideration of risks, benefits and other options for treatment, the patient has consented to  Procedure(s): LEFT TOTAL KNEE REVISION (Left) as a surgical intervention.  The patient's history has been reviewed, patient examined, no change in status, stable for surgery.  I have reviewed the patient's chart and labs.  Questions were answered to the patient's satisfaction.     Nestor Lewandowsky

## 2019-12-29 NOTE — Progress Notes (Signed)
Assisted Dr. Charlene Green with left, ultrasound guided, adductor canal block. Side rails up, monitors on throughout procedure. See vital signs in flow sheet. Tolerated Procedure well.  

## 2019-12-29 NOTE — Anesthesia Procedure Notes (Signed)
Spinal  Patient location during procedure: OR Start time: 12/29/2019 1:05 PM End time: 12/29/2019 1:09 PM Staffing Performed: resident/CRNA  Resident/CRNA: Nelle Don, CRNA Preanesthetic Checklist Completed: patient identified, IV checked, risks and benefits discussed, surgical consent, monitors and equipment checked and pre-op evaluation Spinal Block Patient position: sitting Prep: DuraPrep Patient monitoring: heart rate, continuous pulse ox and blood pressure Approach: midline Location: L3-4 Injection technique: single-shot Needle Needle type: Pencan  Needle gauge: 24 G Needle length: 9 cm

## 2019-12-29 NOTE — Discharge Instructions (Signed)

## 2019-12-29 NOTE — Progress Notes (Signed)
PT Cancellation Note  Patient Details Name: Blake Kennedy MRN: [id number] DOB: 07-Dec-1955   Cancelled Treatment:    Reason Eval/Treat Not Completed: Other (comment) (Patient spinal block not worn off, pt unable to raise LE's off bed for SLR and 2/5 for ankle strength. Will follow up tomorrow for PT eval/treat.)  Renaldo Fiddler PT, DPT Acute Rehabilitation Services  Office [phone] Pager [phone]  12/29/2019 6:41 PM

## 2019-12-29 NOTE — Op Note (Signed)
DATE OF PROCEDURE: 09/27/2012  PREOPERATIVE DIAGNOSIS: L Total Knee Poly bearing failure  Estimated body mass index is 35.23 kg/(m^2) as calculated from the following:  Height as of this encounter: 6' 2.5" (1.892 m).  Weight as of this encounter: 126.1 kg (278 lb).  POSTOPERATIVE DIAGNOSIS: Infected Left Total Knee  PROCEDURE: Revision left total knee arthroplasty with removal of a DePuy Sigma number 410 mm RP bearing that was badly worn and replaced with a similar bearing.  In addition the patella was not loose but had subsurface delamination and was revised removing a 38 mm patella and cementing and a new 38 mm patella.  Patient also had a radical synovectomy for ossification and areas of the synovium.  SURGEON: Gean Birchwood J  ASSISTANT: Tomi Likens. [employer] (present throughout entire procedure and necessary for timely completion of the procedure)  ANESTHESIA: Spinal DRAINS: foley, 2 medium hemovac in knee joint, one small Hemovac in the subcutaneous tissue  TOURNIQUET TIME: Not used COMPLICATIONS: None SPECIMENS: None INDICATIONS FOR PROCEDURE: 64 year old gentleman had bilateral total knees back in the year 2010.  He was followed for about a year and then lost to follow-up.  Over the last decade he is played singles tennis 3 times a week for at least an hour at a time and 5 days ago he noticed that his left total knee was swollen and had gone into varus.  He does not recall hearing a pop.  He did present to our office for radiographs showed loss of the bearing material to the medial side of the left knee.  He did not recall any grinding or metallic sound and the hope was we would be able to replace the bearing without removing the metal implants as long as they were not scored.  The risk benefits of surgery discussed at length.  All questions were answered.   DESCRIPTION OF PROCEDURE: The patient identified by armband, and taken to the operating room, appropriate anesthetic  monitors were  attached and spinal anesthesia induced with  the patient in supine position, Tourniquet  applied high to the operative thigh. Lateral post and foot positioner  applied to the table, the lower extremity was then prepped and draped  in usual sterile fashion from the ankle to the tourniquet. Time-out procedure was performed. We began the operation by making the anterior midline incision starting at handbreadth above the patella going over the patella 1 cm medial to and 6 cm distal to the tibial tubercle, reproducing the old incision. The patella was subluxed laterally and the superficial medial collateral ligament was then elevated from anterior to posterior along the proximal flare of the tibia. We then set about sharply excising all synovium that was visible with the patella everted and the knee flexed.  The synovium was noted to have areas of ossification as well as obvious reaction of the polyethylene.  The joint fluid itself was clear.  We worked our way around posterior medially and were able to sublux the tibia out from [facility] 2 femur and remove the polyethylene bearing in 1 piece noting severe wear posteriorly on the medial side.  There was no scarring noted to the metallic implants.  The implants were tested with 1/4 inch osteotome and found to be stable with no evidence of loosening.  The patella was noted to have subsurface delamination requiring its removal using an ACL saw.  We then cleaned up the patellar cut with a power saw size for 38 mm patellar button and redrilled the patella.  At this point the wound is cleansed out with pulse lavage normal saline solution and we inserted a new number 410 mm bearing took the knee through range of motion of 38 mm patellar trial button and noted to be stable.  The knee was then held in 30 degrees flexion with the patella everted.  The bony surfaces were WaterPik clean and dried with suction and sponges a single batch of HV cement was then mixed and applied to the  bony and plastic surface and a new 38 mm patellar button was pressed into place and excess cement removed.  At this time we also injected the tissues with a solution of 70 cc of normal saline, mixed with 1 vial of Exparel and 1 vial of Marcaine.  At this point the patella was unclamped the knee was reduced and we closed in layers using running #1 Vicryl suture in the parapatellar arthrotomy 3-0 Vicryl suture subcutaneous and subcuticular followed by dressing of Aquacil and an Ace wrap.  At this point the patient was awakened and taken to the recovery room without difficulty.   Gean Birchwood J  09/27/2012, 2:57 PM

## 2019-12-30 ENCOUNTER — Encounter (HOSPITAL_COMMUNITY): Payer: Self-pay | Admitting: Orthopedic Surgery

## 2019-12-30 DIAGNOSIS — M1712 Unilateral primary osteoarthritis, left knee: Secondary | ICD-10-CM | POA: Diagnosis not present

## 2019-12-30 LAB — CBC
HCT: 30.2 % — ABNORMAL LOW (ref 39.0–52.0)
Hemoglobin: 10 g/dL — ABNORMAL LOW (ref 13.0–17.0)
MCH: 30.3 pg (ref 26.0–34.0)
MCHC: 33.1 g/dL (ref 30.0–36.0)
MCV: 91.5 fL (ref 80.0–100.0)
Platelets: 218 10*3/uL (ref 150–400)
RBC: 3.3 MIL/uL — ABNORMAL LOW (ref 4.22–5.81)
RDW: 12.9 % (ref 11.5–15.5)
WBC: 12.2 10*3/uL — ABNORMAL HIGH (ref 4.0–10.5)
nRBC: 0 % (ref 0.0–0.2)

## 2019-12-30 LAB — BASIC METABOLIC PANEL
Anion gap: 9 (ref 5–15)
BUN: 13 mg/dL (ref 8–23)
CO2: 24 mmol/L (ref 22–32)
Calcium: 8.9 mg/dL (ref 8.9–10.3)
Chloride: 106 mmol/L (ref 98–111)
Creatinine, Ser: 1 mg/dL (ref 0.61–1.24)
GFR, Estimated: >60 mL/min (ref >60)
Glucose, Bld: 156 mg/dL — ABNORMAL HIGH (ref 70–99)
Potassium: 4.2 mmol/L (ref 3.5–5.1)
Sodium: 139 mmol/L (ref 135–145)

## 2019-12-30 MED ORDER — POLYMYXIN B-TRIMETHOPRIM 10000-0.1 UNIT/ML-% OP SOLN
1.0000 [drp] | OPHTHALMIC | Status: DC
  Administered 2019-12-30 (×2): 1 [drp] via OPHTHALMIC

## 2019-12-30 MED ORDER — KETOROLAC TROMETHAMINE 0.5 % OP SOLN
1.0000 [drp] | Freq: Four times a day (QID) | OPHTHALMIC | Status: DC
  Administered 2019-12-30: 09:00:00 1 [drp] via OPHTHALMIC

## 2019-12-30 NOTE — Evaluation (Signed)
Physical Therapy Evaluation Patient Details Name: Blake Kennedy MRN: [id number] DOB: [birth year] Today's Date: 12/30/2019   History of Present Illness  64 y.o. male admitted for L TKA revision on 12/29/19. PMH of CKD and DVT.  Clinical Impression  Pt ambulated 200' with RW, no loss of balance. Stair training completed, pt demonstrates good understanding of HEP. He is ready to DC home from PT standpoint.     Follow Up Recommendations Follow surgeon's recommendation for DC plan and follow-up therapies;Home health PT    Equipment Recommendations  None recommended by PT    Recommendations for Other Services       Precautions / Restrictions Precautions Precautions: Knee Precaution Booklet Issued: Yes (comment) Precaution Comments: reviewed no pillow under knee Restrictions Weight Bearing Restrictions: No LLE Weight Bearing: Weight bearing as tolerated      Mobility  Bed Mobility Overal bed mobility: Modified Independent             General bed mobility comments: used rail, HOB up    Transfers Overall transfer level: Needs assistance Equipment used: Rolling walker (2 wheeled) Transfers: Sit to/from Stand Sit to Stand: Supervision         General transfer comment: VCs hand placement  Ambulation/Gait Ambulation/Gait assistance: Supervision Gait Distance (Feet): 200 Feet Assistive device: Rolling walker (2 wheeled) Gait Pattern/deviations: Step-through pattern;Decreased stride length Gait velocity: decr   General Gait Details: VCs sequencing, no loss of balance  Stairs Stairs: Yes Stairs assistance: Supervision Stair Management: One rail Left;Forwards Number of Stairs: 5 General stair comments: VCs sequencing  Wheelchair Mobility    Modified Rankin (Stroke Patients Only)       Balance Overall balance assessment: Modified Independent                                           Pertinent Vitals/Pain Pain Assessment: 0-10 Pain  Score: 3  Pain Location: L knee Pain Descriptors / Indicators: Sore Pain Intervention(s): Limited activity within patient's tolerance;Monitored during session;Premedicated before session;Ice applied    Home Living Family/patient expects to be discharged to:: Private residence Living Arrangements: Spouse/significant other Available Help at Discharge: Family;Available 24 hours/day Type of Home: House Home Access: Stairs to enter Entrance Stairs-Rails: Left Entrance Stairs-Number of Steps: 4 Home Layout: One level Home Equipment: Walker - 2 wheels      Prior Function Level of Independence: Independent         Comments: very active with tennis, swimming     Hand Dominance        Extremity/Trunk Assessment   Upper Extremity Assessment Upper Extremity Assessment: Overall WFL for tasks assessed    Lower Extremity Assessment Lower Extremity Assessment: LLE deficits/detail LLE Deficits / Details: SLR -3/5, knee ext -3/5, knee AAROM 0-65* LLE Sensation: WNL LLE Coordination: WNL    Cervical / Trunk Assessment Cervical / Trunk Assessment: Normal  Communication   Communication: No difficulties  Cognition Arousal/Alertness: Awake/alert Behavior During Therapy: WFL for tasks assessed/performed Overall Cognitive Status: Within Functional Limits for tasks assessed                                        General Comments      Exercises Total Joint Exercises Ankle Circles/Pumps: AROM;Both;10 reps;Supine Quad Sets: AROM;Left;5 reps;Supine Short Arc Quad: AAROM;Left;10 reps;Supine Heel  Slides: AAROM;Left;10 reps;Supine Hip ABduction/ADduction: AAROM;Left;10 reps;Supine Straight Leg Raises: AAROM;Left;5 reps;Supine Long Arc Quad: AAROM;Left;10 reps;Seated Knee Flexion: AAROM;Left;10 reps;Seated Goniometric ROM: 0-65* AAROM L knee   Assessment/Plan    PT Assessment Patent does not need any further PT services  PT Problem List         PT Treatment  Interventions      PT Goals (Current goals can be found in the Care Plan section)  Acute Rehab PT Goals Patient Stated Goal: tennis, swimming, ice climbing PT Goal Formulation: All assessment and education complete, DC therapy    Frequency     Barriers to discharge        Co-evaluation               AM-PAC PT "6 Clicks" Mobility  Outcome Measure Help needed turning from your back to your side while in a flat bed without using bedrails?: None Help needed moving from lying on your back to sitting on the side of a flat bed without using bedrails?: None Help needed moving to and from a bed to a chair (including a wheelchair)?: A Little Help needed standing up from a chair using your arms (e.g., wheelchair or bedside chair)?: A Little Help needed to walk in hospital room?: A Little Help needed climbing 3-5 steps with a railing? : A Little 6 Click Score: 20    End of Session Equipment Utilized During Treatment: Gait belt Activity Tolerance: Patient tolerated treatment well Patient left: in chair;with call bell/phone within reach;with chair alarm set Nurse Communication: Mobility status      Time: 6384-6659 PT Time Calculation (min) (ACUTE ONLY): 45 min   Charges:   PT Evaluation $PT Eval Low Complexity: 1 Low PT Treatments $Gait Training: 8-22 mins $Therapeutic Exercise: 8-22 mins       Ralene Bathe Kistler PT 12/30/2019  Acute Rehabilitation Services Pager [phone] Office [phone]

## 2019-12-30 NOTE — Discharge Summary (Signed)
Patient ID: Blake Kennedy MRN: [id number] DOB/AGE: May 16, 1955 64 y.o.  Admit date: 12/29/2019 Discharge date: 12/30/2019  Admission Diagnoses:  Principal Problem:   Painful total knee replacement, left (HCC) Active Problems:   S/P revision of total knee, left   Discharge Diagnoses:  Same  Past Medical History:  Diagnosis Date  . Anemia   . Arthritis   . DVT (deep venous thrombosis) (HCC)   . Hyperlipidemia   . Prothrombin gene mutation [facility])     Surgeries: Procedure(s): LEFT TOTAL KNEE REVISION on 12/29/2019   Consultants:   Discharged Condition: Improved  Hospital Course: Blake Kennedy is an 64 y.o. male who was admitted 12/29/2019 for operative treatment ofPainful total knee replacement, left (HCC). Patient has severe unremitting pain that affects sleep, daily activities, and work/hobbies. After pre-op clearance the patient was taken to the operating room on 12/29/2019 and underwent  Procedure(s): LEFT TOTAL KNEE REVISION.    Patient was given perioperative antibiotics:  Anti-infectives (From admission, onward)   Start     Dose/Rate Route Frequency Ordered Stop   12/29/19 1015  ceFAZolin (ANCEF) IVPB 2g/100 mL premix        2 g 200 mL/hr over 30 Minutes Intravenous On call to O.R. 12/29/19 1008 12/29/19 1339       Patient was given sequential compression devices, early ambulation, and chemoprophylaxis to prevent DVT.  Patient benefited maximally from hospital stay and there were no complications.    Recent vital signs:  Patient Vitals for the past 24 hrs:  BP Temp Temp src Pulse Resp SpO2 Height Weight  12/30/19 0521 116/66 98.3 F (36.8 C) Oral 72 18 97 % no documentation no documentation  12/30/19 0149 122/71 98.7 F (37.1 C) Oral 70 18 98 % no documentation no documentation  12/29/19 2323 137/72 98.7 F (37.1 C) Oral 73 18 97 % no documentation no documentation  12/29/19 2227 139/77 98.7 F (37.1 C) Oral 77 18 97 % no documentation no documentation   12/29/19 2139 (Abnormal) 170/85 97.9 F (36.6 C) Oral 77 18 96 % no documentation no documentation  12/29/19 2051 (Abnormal) 183/82 97.6 F (36.4 C) Oral 82 18 99 % 5\' 10"  (1.778 m) 91.6 kg  12/29/19 2037 (Abnormal) 183/82 97.6 F (36.4 C) Oral 82 18 99 % no documentation no documentation  12/29/19 1725 (Abnormal) 147/77 no documentation no documentation 74 12 100 % no documentation no documentation  12/29/19 1715 (Abnormal) 149/70 no documentation no documentation 68 11 99 % no documentation no documentation  12/29/19 1645 (Abnormal) 152/127 no documentation no documentation 70 16 99 % no documentation no documentation  12/29/19 1630 (Abnormal) 148/81 no documentation no documentation 69 13 99 % no documentation no documentation  12/29/19 1615 139/80 no documentation no documentation (Abnormal) 58 13 99 % no documentation no documentation  12/29/19 1600 139/73 no documentation no documentation 73 13 97 % no documentation no documentation  12/29/19 1545 120/69 no documentation no documentation 66 13 95 % no documentation no documentation  12/29/19 1530 112/71 (Abnormal) 97 F (36.1 C) no documentation 63 16 100 % no documentation no documentation  12/29/19 1515 109/64 no documentation no documentation 63 12 100 % no documentation no documentation  12/29/19 1510 101/62 (Abnormal) 96.4 F (35.8 C) no documentation no documentation (Abnormal) 9 100 % no documentation no documentation  12/29/19 1235 120/65 no documentation no documentation 63 11 100 % no documentation no documentation  12/29/19 1152 133/87 no documentation no documentation 63 13 100 % no documentation  no documentation  12/29/19 1140 (Abnormal) 159/73 no documentation no documentation (Abnormal) 59 12 100 % no documentation no documentation  12/29/19 1050 (Abnormal) 181/89 98.6 F (37 C) Oral 68 18 100 % 5\' 10"  (1.778 m) 91.6 kg     Recent laboratory studies:  Recent Labs    12/29/19 1035 12/30/19 0454  WBC 5.8 12.2*  HGB  12.0* 10.0*  HCT 37.4* 30.2*  PLT 251 218  NA  --  139  K  --  4.2  CL  --  106  CO2  --  24  BUN  --  13  CREATININE  --  1.00  GLUCOSE  --  156*  INR 1.1  --   CALCIUM  --  8.9     Discharge Medications:   Allergies as of 12/30/2019   No Known Allergies     Medication List    Take these medications   atorvastatin 10 MG tablet Commonly known as: LIPITOR Take 10 mg by mouth daily.   AUSTRALIAN DREAM ARTHRITIS EX Apply 1 application topically at bedtime.   diphenhydramine-acetaminophen 25-500 MG Tabs tablet Commonly known as: TYLENOL PM Take 2-3 tablets by mouth at bedtime as needed (sleep).   Fish Oil 1000 MG Caps Take 1,000 mg by mouth daily.   multivitamin with minerals Tabs tablet Take 1 tablet by mouth daily.   oxyCODONE-acetaminophen 5-325 MG tablet Commonly known as: PERCOCET/ROXICET Take 1 tablet by mouth every 4 (four) hours as needed for severe pain.   pseudoephedrine 30 MG tablet Commonly known as: SUDAFED Take 30 mg by mouth every 4 (four) hours as needed for congestion.   rivaroxaban 10 MG Tabs tablet Commonly known as: XARELTO Take 1 tablet (10 mg total) by mouth daily. What changed:   medication strength  how much to take  when to take this   sildenafil 50 MG tablet Commonly known as: VIAGRA Take 50 mg by mouth daily as needed for erectile dysfunction.   tiZANidine 2 MG tablet Commonly known as: ZANAFLEX Take 1 tablet (2 mg total) by mouth every 6 (six) hours as needed.   TURMERIC PO Take 1 tablet by mouth daily.   VITAMIN D3 PO Take 2 capsules by mouth daily.        Durable Medical Equipment  (From admission, onward)         Start     Ordered   12/29/19 2047  DME Walker rolling  Once       Question:  Patient needs a walker to treat with the following condition  Answer:  Status post total left knee replacement   12/29/19 2046   12/29/19 2047  DME 3 n 1  Once        12/29/19 2046           Discharge Care  Instructions  (From admission, onward)         Start     Ordered   12/30/19 0000  Change dressing       Comments: Change dressing Only if drainage exceeds 40% of window on dressing   12/30/19 0721   12/29/19 0000  Weight bearing as tolerated        12/29/19 1513          Diagnostic Studies: No results found.  Disposition: Discharge disposition: 01-Home or Self Care       Discharge Instructions    Call MD / Call 911   Complete by: As directed    If you experience  chest pain or shortness of breath, CALL 911 and be transported to the hospital emergency room.  If you develope a fever above 101 F, pus (white drainage) or increased drainage or redness at the wound, or calf pain, call your surgeon's office.   Call MD / Call 911   Complete by: As directed    If you experience chest pain or shortness of breath, CALL 911 and be transported to the hospital emergency room.  If you develope a fever above 101 F, pus (white drainage) or increased drainage or redness at the wound, or calf pain, call your surgeon's office.   Change dressing   Complete by: As directed    Change dressing Only if drainage exceeds 40% of window on dressing   Constipation Prevention   Complete by: As directed    Drink plenty of fluids.  Prune juice may be helpful.  You may use a stool softener, such as Colace (over the counter) 100 mg twice a day.  Use MiraLax (over the counter) for constipation as needed.   Constipation Prevention   Complete by: As directed    Drink plenty of fluids.  Prune juice may be helpful.  You may use a stool softener, such as Colace (over the counter) 100 mg twice a day.  Use MiraLax (over the counter) for constipation as needed.   Diet - low sodium heart healthy   Complete by: As directed    Driving restrictions   Complete by: As directed    No driving for 2 weeks   Increase activity slowly as tolerated   Complete by: As directed    Increase activity slowly as tolerated   Complete  by: As directed    Patient may shower   Complete by: As directed    You may shower without a dressing once there is no drainage.  Do not wash over the wound.  If drainage remains, cover wound with plastic wrap and then shower.   Weight bearing as tolerated   Complete by: As directed        Follow-up Information    Gean Birchwood, MD In 2 weeks.   Specialty: Orthopedic Surgery Contact information: [address] [place] [address] [phone]                Signed: Nestor Lewandowsky 12/30/2019, 7:22 AM

## 2019-12-30 NOTE — Plan of Care (Signed)
  Problem: Education: Goal: Knowledge of General Education information will improve Description: Including pain rating scale, medication(s)/side effects and non-pharmacologic comfort measures Outcome: Progressing   Problem: Clinical Measurements: Goal: Ability to maintain clinical measurements within normal limits will improve Outcome: Progressing   Problem: Clinical Measurements: Goal: Will remain free from infection Outcome: Progressing   Problem: Clinical Measurements: Goal: Cardiovascular complication will be avoided Outcome: Progressing   Problem: Activity: Goal: Risk for activity intolerance will decrease Outcome: Progressing   Problem: Elimination: Goal: Will not experience complications related to bowel motility Outcome: Progressing   Problem: Elimination: Goal: Will not experience complications related to urinary retention Outcome: Progressing   Problem: Elimination: Goal: Will not experience complications related to urinary retention Outcome: Progressing   Problem: Pain Managment: Goal: General experience of comfort will improve Outcome: Progressing   Problem: Safety: Goal: Ability to remain free from injury will improve Outcome: Progressing   Problem: Skin Integrity: Goal: Risk for impaired skin integrity will decrease Outcome: Progressing

## 2019-12-30 NOTE — Progress Notes (Signed)
PATIENT ID: Blake Kennedy  MRN: [id number]  DOB/AGE:  Dec 25, 1955 / 64 y.o.  1 Day Post-Op Procedure(s) (LRB): LEFT TOTAL KNEE REVISION (Left)    PROGRESS NOTE Subjective: Patient is alert, oriented, no Nausea, no Vomiting, yes passing gas. Taking PO well. Denies SOB, Chest or Calf Pain. Using Incentive Spirometer, PAS in place. Ambulate WBAT, Patient reports pain as 2/10 .    Objective: Vital signs in last 24 hours: Vitals:   12/29/19 2227 12/29/19 2323 12/30/19 0149 12/30/19 0521  BP: 139/77 137/72 122/71 116/66  Pulse: 77 73 70 72  Resp: 18 18 18 18   Temp: 98.7 F (37.1 C) 98.7 F (37.1 C) 98.7 F (37.1 C) 98.3 F (36.8 C)  TempSrc: Oral Oral Oral Oral  SpO2: 97% 97% 98% 97%  Weight:      Height:          Intake/Output from previous day: I/O last 3 completed shifts: In: 2590.8 [P.O.:720; I.V.:1670.8; IV Piggyback:200] Out: 1750 [Urine:1700; Blood:50]   Intake/Output this shift: No intake/output data recorded.   LABORATORY DATA: Recent Labs    12/29/19 1035 12/30/19 0454  WBC 5.8 12.2*  HGB 12.0* 10.0*  HCT 37.4* 30.2*  PLT 251 218  NA  --  139  K  --  4.2  CL  --  106  CO2  --  24  BUN  --  13  CREATININE  --  1.00  GLUCOSE  --  156*  INR 1.1  --   CALCIUM  --  8.9    Examination: Neurologically intact ABD soft Neurovascular intact Sensation intact distally Intact pulses distally Dorsiflexion/Plantar flexion intact Incision: dressing C/D/I No cellulitis present Compartment soft}  Assessment:   1 Day Post-Op Procedure(s) (LRB): LEFT TOTAL KNEE REVISION (Left) ADDITIONAL DIAGNOSIS: Expected Acute Blood Loss Anemia   Plan: PT/OT WBAT, AROM and PROM  DVT Prophylaxis:  SCDx72hrs, ASA 81 mg BID x 2 weeks DISCHARGE PLAN: Home, today if passes PT DISCHARGE NEEDS: HHPT, Walker and 3-in-1 comode seat     01/01/20 12/30/2019, 7:16 AM Patient ID: 01/01/2020, male   DOB: 09-28-1955, 64 y.o.   MRN: [id number]
